# Patient Record
Sex: Female | Born: 2000 | Race: Black or African American | Hispanic: No | Marital: Single | Smoking: Former smoker
Health system: Southern US, Community
[De-identification: ages and names within clinical notes are randomized; demographics above are authoritative.]

## PROBLEM LIST (undated history)

## (undated) DIAGNOSIS — R519 Headache, unspecified: Secondary | ICD-10-CM

## (undated) DIAGNOSIS — G8929 Other chronic pain: Secondary | ICD-10-CM

## (undated) DIAGNOSIS — R51 Headache: Secondary | ICD-10-CM

## (undated) DIAGNOSIS — J45909 Unspecified asthma, uncomplicated: Secondary | ICD-10-CM

## (undated) HISTORY — DX: Unspecified asthma, uncomplicated: J45.909

## (undated) HISTORY — PX: NO PAST SURGERIES: SHX2092

---
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
## 2021-08-30 ENCOUNTER — Inpatient Hospital Stay (HOSPITAL_COMMUNITY)
Admission: AD | Admit: 2021-08-30 | Discharge: 2021-08-30 | Disposition: A | Discharge status: Home / Self Care | Payer: Medicaid Other | Attending: Obstetrics & Gynecology | Admitting: Obstetrics & Gynecology

## 2021-08-30 ENCOUNTER — Encounter (HOSPITAL_COMMUNITY): Payer: Self-pay

## 2021-08-30 DIAGNOSIS — Y9289 Other specified places as the place of occurrence of the external cause: Secondary | ICD-10-CM | POA: Diagnosis not present

## 2021-08-30 DIAGNOSIS — O26891 Other specified pregnancy related conditions, first trimester: Secondary | ICD-10-CM | POA: Insufficient documentation

## 2021-08-30 DIAGNOSIS — M25551 Pain in right hip: Secondary | ICD-10-CM

## 2021-08-30 DIAGNOSIS — W010XXA Fall on same level from slipping, tripping and stumbling without subsequent striking against object, initial encounter: Secondary | ICD-10-CM | POA: Diagnosis not present

## 2021-08-30 DIAGNOSIS — W19XXXA Unspecified fall, initial encounter: Secondary | ICD-10-CM

## 2021-08-30 DIAGNOSIS — Y990 Civilian activity done for income or pay: Secondary | ICD-10-CM | POA: Diagnosis not present

## 2021-08-30 DIAGNOSIS — Z3A17 17 weeks gestation of pregnancy: Secondary | ICD-10-CM | POA: Diagnosis not present

## 2021-08-30 DIAGNOSIS — M25561 Pain in right knee: Secondary | ICD-10-CM | POA: Insufficient documentation

## 2021-08-30 LAB — POCT PREGNANCY, URINE: Preg Test, Ur: POSITIVE — AB

## 2021-08-30 NOTE — MAU Note (Signed)
.  Shelley Hill is a 21 y.o. at [redacted]w[redacted]d here in MAU reporting: fell at work about 2 hours ago.Pt reports right sided pain from where she fell and slight ABD cramping. Pt stated slipped on a water on a hardwood floor and fell on her left side. Pt denies CTX, LOF, VB, and trauma to ABD. Pt states she does not have any PNC or OBGYN, has been to [facility] confirmation and ultrasound.   Onset of complaint: 1800 Pain score: 5/10 side, 4/10 cramping Vitals:   08/30/21 2017  BP: 130/70  Pulse: 88  Resp: 18  Temp: 98.2 F (36.8 C)  SpO2: 98%     FHT:152 Lab orders placed from triage:

## 2021-08-30 NOTE — MAU Provider Note (Incomplete)
Chief Complaint:  Fall   Event Date/Time   First Provider Initiated Contact with Patient 08/30/21 2045     HPI: Shelley Hill is a 21 y.o. G1P0 at 65w5dwho presents to maternity admissions reporting having fallen at work on wet floor.  Landed on right hip  Did not strike abdomen. . She denies LOF, vaginal bleeding, vaginal itching/burning, urinary symptoms, h/a, dizziness, n/v, diarrhea, constipation or fever/chills.  .  Fall The accident occurred 1 to 3 hours ago. The fall occurred while walking. She landed on [employer]. There was no blood loss. Point of impact: right hip. The pain is present in the right hip. The symptoms are aggravated by ambulation and pressure on injury. Pertinent negatives include no abdominal pain, fever, headaches or loss of consciousness. She has tried nothing for the symptoms.   RN Note: Shelley Hill is a 21 y.o. at [redacted]w[redacted]d here in MAU reporting: fell at work about 2 hours ago.Pt reports right sided pain from where she fell and slight ABD cramping. Pt stated slipped on a water on a hardwood floor and fell on her left side. Pt denies CTX, LOF, VB, and trauma to ABD. Pt states she does not have any PNC or OBGYN, has been to [facility] confirmation and ultrasound.   Past Medical History: Past Medical History:  Diagnosis Date   Chronic headaches     Past obstetric history: OB History  Gravida Para Term Preterm AB Living  1            SAB IAB Ectopic Multiple Live Births               # Outcome Date GA Lbr Len/2nd Weight Sex Delivery Anes PTL Lv  1 Current             Past Surgical History: History reviewed. No pertinent surgical history.  Family History: History reviewed. No pertinent family history.  Social History: Social History   Tobacco Use   Smoking status: Never   Smokeless tobacco: Never  Substance Use Topics   Alcohol use: No    Allergies: No Known Allergies  Meds:  Medications Prior to Admission  Medication Sig  Dispense Refill Last Dose   ranitidine (ZANTAC) 150 MG capsule Take 1 capsule (150 mg total) by mouth daily. (Patient not taking: Reported on 08/30/2021) 30 capsule 0 Not Taking    I have reviewed patient's Past Medical Hx, Surgical Hx, Family Hx, Social Hx, medications and allergies.   ROS:  Review of Systems  Constitutional:  Negative for fever.  Gastrointestinal:  Negative for abdominal pain.  Neurological:  Negative for loss of consciousness and headaches.   Other systems negative  Physical Exam  Patient Vitals for the past 24 hrs:  BP Temp Pulse Resp SpO2 Height Weight  08/30/21 2017 130/70 98.2 F (36.8 C) 88 18 98 % 5' 2.5" (1.588 m) 94.8 kg   Constitutional: Well-developed, well-nourished female in no acute distress.  Cardiovascular: normal rate and rhythm Respiratory: normal effort, clear to auscultation bilaterally GI: Abd soft, non-tender, gravid appropriate for gestational age.   No rebound or guarding. MS: Extremities nontender, no edema, normal ROM   Normal range of motion with hip and leg.  No deformity, obvious injury or impairment noted Neurologic: Alert and oriented x 4.  GU: Neg CVAT.   FHT:  152      Labs: Results for orders placed or performed during the hospital encounter of 08/30/21 (from the past 24 hour(s))  Pregnancy, urine POC  Status: Abnormal   Collection Time: 08/30/21  7:55 PM  Result Value Ref Range   Preg Test, Ur POSITIVE (A) NEGATIVE    Imaging:  Informal bedside US done for reassurance. Pt informed that the ultrasound is considered a limited OB ultrasound and is not intended to be a complete ultrasound exam.  Patient also informed that the ultrasound is not being completed with the intent of assessing for fetal or placental anomalies or any pelvic abnormalities.  Explained that the purpose of today's ultrasound is to assess for reassurance Patient acknowledges the purpose of the exam and the limitations of the study.    SIngle active  Fetus noted, size appropriate for dates, but no measurements taken FHR 160s.  Fetus very active   Placenta anterior/left  MAU Course/MDM: I have reviewed the triage vital signs and the nursing notes.   Pertinent labs & imaging results that were available during my care of the patient were reviewed by me and considered in my medical decision making (see chart for details).      I have reviewed her medical records including past results, notes and treatments.   DIscussed anatomy and stage of gestation is protective for fetus at this gestational age Discussed the uterus and amniotic sac provide cushioning Reassured patient and her mother with [place] verification of fetal well-being  Assessment: SIngle IUP at [redacted]w[redacted]d Status post fall on right hip Reassuring fetal status  Plan: Discharge home Preterm Labor precautions and fetal kick counts Follow up in Office for prenatal visits  List of providers given Encouraged to return if she develops worsening of symptoms, increase in pain, fever, or other concerning symptoms.  Pt stable at time of discharge.  Wynelle Bourgeois CNM, MSN Certified Nurse-Midwife 08/30/2021 8:45 PM

## 2021-08-30 NOTE — Discharge Instructions (Signed)
[facility]   [facility]             [address], [place], [place] [address] [phone]  [facility]                                                             [address], [address], [place], [place], [address] [phone]  [facility] [place]                                    [address], [address], [place], [place], [address] [phone]  [facility] [place] [address], [address], [place], [place], [address] [phone]  [facility] [place] [facility]                                 [address], [place], [place], [address] [phone]  [facility]                                    [address], [place], [place], [address] [phone]  [facility] [address], [address], [place], [place], [address]                              [facility] [address], [address], [place], [place], [address] [phone]  [facility]         Phone: [phone]  [facility]      Phone: [phone]   [facility]      Phone: [phone]  [facility]         Phone: [phone]   [facility]    Phone: [phone]  [facility]      Phone: [phone]  [facility]      Phone: [phone]  [facility]        Phone: [phone]  [facility] (Sheronette Cousins) [phone]  [facility]      Phone: [phone]   

## 2021-09-14 LAB — OB RESULTS CONSOLE HEPATITIS B SURFACE ANTIGEN: Hepatitis B Surface Ag: NEGATIVE

## 2021-09-14 LAB — OB RESULTS CONSOLE ANTIBODY SCREEN: Antibody Screen: NEGATIVE

## 2021-09-14 LAB — OB RESULTS CONSOLE ABO/RH: RH Type: POSITIVE

## 2021-09-14 LAB — HEPATITIS C ANTIBODY: HCV Ab: NEGATIVE

## 2021-09-14 LAB — OB RESULTS CONSOLE GC/CHLAMYDIA
Chlamydia: NEGATIVE
Neisseria Gonorrhea: NEGATIVE

## 2021-09-14 LAB — OB RESULTS CONSOLE RUBELLA ANTIBODY, IGM: Rubella: IMMUNE

## 2021-09-14 LAB — OB RESULTS CONSOLE HIV ANTIBODY (ROUTINE TESTING): HIV: NONREACTIVE

## 2021-09-14 LAB — OB RESULTS CONSOLE RPR: RPR: NONREACTIVE

## 2021-09-29 ENCOUNTER — Other Ambulatory Visit: Payer: Self-pay

## 2021-10-02 ENCOUNTER — Other Ambulatory Visit: Payer: Self-pay | Admitting: Obstetrics & Gynecology

## 2021-10-02 DIAGNOSIS — Z3A22 22 weeks gestation of pregnancy: Secondary | ICD-10-CM

## 2021-10-02 DIAGNOSIS — Z363 Encounter for antenatal screening for malformations: Secondary | ICD-10-CM

## 2021-10-03 ENCOUNTER — Encounter: Payer: Self-pay | Admitting: *Deleted

## 2021-10-03 ENCOUNTER — Ambulatory Visit: Payer: Medicaid Other | Admitting: *Deleted

## 2021-10-03 ENCOUNTER — Other Ambulatory Visit: Payer: Self-pay | Admitting: *Deleted

## 2021-10-03 ENCOUNTER — Ambulatory Visit: Payer: Medicaid Other | Attending: Obstetrics & Gynecology

## 2021-10-03 DIAGNOSIS — Z3A22 22 weeks gestation of pregnancy: Secondary | ICD-10-CM | POA: Insufficient documentation

## 2021-10-03 DIAGNOSIS — Z363 Encounter for antenatal screening for malformations: Secondary | ICD-10-CM | POA: Insufficient documentation

## 2021-10-03 DIAGNOSIS — O99210 Obesity complicating pregnancy, unspecified trimester: Secondary | ICD-10-CM

## 2021-10-31 ENCOUNTER — Ambulatory Visit: Payer: Medicaid Other | Attending: Maternal & Fetal Medicine

## 2021-10-31 ENCOUNTER — Ambulatory Visit: Payer: Medicaid Other | Admitting: *Deleted

## 2021-10-31 ENCOUNTER — Encounter: Payer: Self-pay | Admitting: *Deleted

## 2021-10-31 DIAGNOSIS — O99212 Obesity complicating pregnancy, second trimester: Secondary | ICD-10-CM | POA: Diagnosis not present

## 2021-10-31 DIAGNOSIS — O99210 Obesity complicating pregnancy, unspecified trimester: Secondary | ICD-10-CM | POA: Diagnosis present

## 2021-10-31 DIAGNOSIS — Z362 Encounter for other antenatal screening follow-up: Secondary | ICD-10-CM | POA: Diagnosis not present

## 2021-10-31 DIAGNOSIS — Z3A26 26 weeks gestation of pregnancy: Secondary | ICD-10-CM | POA: Diagnosis not present

## 2021-10-31 DIAGNOSIS — E669 Obesity, unspecified: Secondary | ICD-10-CM

## 2021-11-01 ENCOUNTER — Other Ambulatory Visit: Payer: Self-pay | Admitting: *Deleted

## 2021-11-01 DIAGNOSIS — O99212 Obesity complicating pregnancy, second trimester: Secondary | ICD-10-CM

## 2021-11-01 DIAGNOSIS — Z6839 Body mass index (BMI) 39.0-39.9, adult: Secondary | ICD-10-CM

## 2021-11-28 ENCOUNTER — Ambulatory Visit: Payer: Medicaid Other | Attending: Maternal & Fetal Medicine

## 2021-11-28 ENCOUNTER — Ambulatory Visit: Payer: Medicaid Other | Admitting: *Deleted

## 2021-11-28 VITALS — BP 129/74 | HR 96

## 2021-11-28 DIAGNOSIS — Z3A30 30 weeks gestation of pregnancy: Secondary | ICD-10-CM | POA: Diagnosis not present

## 2021-11-28 DIAGNOSIS — O99213 Obesity complicating pregnancy, third trimester: Secondary | ICD-10-CM | POA: Diagnosis not present

## 2021-11-28 DIAGNOSIS — E669 Obesity, unspecified: Secondary | ICD-10-CM | POA: Diagnosis not present

## 2021-11-28 DIAGNOSIS — O99210 Obesity complicating pregnancy, unspecified trimester: Secondary | ICD-10-CM | POA: Insufficient documentation

## 2021-11-29 ENCOUNTER — Other Ambulatory Visit: Payer: Self-pay | Admitting: *Deleted

## 2021-11-29 DIAGNOSIS — R638 Other symptoms and signs concerning food and fluid intake: Secondary | ICD-10-CM

## 2021-11-29 DIAGNOSIS — O99213 Obesity complicating pregnancy, third trimester: Secondary | ICD-10-CM

## 2021-12-11 LAB — OB RESULTS CONSOLE HIV ANTIBODY (ROUTINE TESTING): HIV: NONREACTIVE

## 2021-12-11 LAB — OB RESULTS CONSOLE RPR: RPR: NONREACTIVE

## 2021-12-22 ENCOUNTER — Inpatient Hospital Stay (HOSPITAL_COMMUNITY)
Admission: AD | Admit: 2021-12-22 | Discharge: 2021-12-22 | Disposition: A | Discharge status: Home / Self Care | Payer: Medicaid Other | Attending: Obstetrics & Gynecology | Admitting: Obstetrics & Gynecology

## 2021-12-22 ENCOUNTER — Encounter (HOSPITAL_COMMUNITY): Payer: Self-pay | Admitting: Obstetrics & Gynecology

## 2021-12-22 DIAGNOSIS — Z3A34 34 weeks gestation of pregnancy: Secondary | ICD-10-CM

## 2021-12-22 DIAGNOSIS — Z3689 Encounter for other specified antenatal screening: Secondary | ICD-10-CM

## 2021-12-22 DIAGNOSIS — O368130 Decreased fetal movements, third trimester, not applicable or unspecified: Secondary | ICD-10-CM | POA: Diagnosis present

## 2021-12-22 NOTE — MAU Provider Note (Signed)
History     [id number]  Arrival date and time: 12/22/21 1948    Chief Complaint  Patient presents with   Decreased Fetal Movement     HPI Shelley Hill is a 21 y.o. at [redacted]w[redacted]d who presents for decreased fetal movement. First noticed 3 days ago. States she only felt baby move 3 times today prior to coming to MAU. Denies abdominal pain, vaginal bleeding, or LOF. Goes to [facility]. Denies complications with pregnancy. Has next OB appointment on Monday.    OB History     Gravida  1   Para      Term      Preterm      AB      Living         SAB      IAB      Ectopic      Multiple      Live Births              Past Medical History:  Diagnosis Date   Chronic headaches     Past Surgical History:  Procedure Laterality Date   NO PAST SURGERIES      Family History  Problem Relation Age of Onset   Epilepsy Father    Cancer Brother    Hypertension Paternal Grandmother    Heart disease Paternal Grandmother    Asthma Neg Hx     No Known Allergies  No current facility-administered medications on file prior to encounter.   Current Outpatient Medications on File Prior to Encounter  Medication Sig Dispense Refill   Prenatal Vit-Fe Fumarate-FA (MULTIVITAMIN-PRENATAL) 27-0.8 MG TABS tablet Take 1 tablet by mouth daily at 12 noon.       ROS Pertinent positives and negative per HPI, all others reviewed and negative  Physical Exam   BP 139/75   Pulse 93   Temp 98.2 F (36.8 C) (Oral)   Resp 18   Ht 5' 2.5" (1.588 m)   Wt 100.5 kg   LMP 04/28/2021   SpO2 97%   BMI 39.89 kg/m   Patient Vitals for the past 24 hrs:  BP Temp Temp src Pulse Resp SpO2 Height Weight  12/22/21 2016 -- -- -- -- -- 97 % -- --  12/22/21 2015 139/75 -- -- 93 -- -- -- --  12/22/21 1958 129/71 98.2 F (36.8 C) Oral 94 18 -- 5' 2.5" (1.588 m) 100.5 kg    Physical Exam Vitals and nursing note reviewed.  Constitutional:      General: She is not in acute distress.     Appearance: Normal appearance.  HENT:     Head: Normocephalic and atraumatic.  Eyes:     General: No scleral icterus.    Conjunctiva/sclera: Conjunctivae normal.  Pulmonary:     Effort: Pulmonary effort is normal. No respiratory distress.  Neurological:     Mental Status: She is alert.  Psychiatric:        Mood and Affect: Mood normal.        Behavior: Behavior normal.       FHT Baseline 135, moderate variability, 15x15 accels, no decels Toco: none Cat: 1  Labs No results found for this or any previous visit (from the past 24 hour(s)).  Imaging No results found.  MAU Course  Procedures Lab Orders  No laboratory test(s) ordered today   No orders of the defined types were placed in this encounter.  Imaging Orders  No imaging studies ordered today    MDM  Patient documented 10 movements during her NST Reactive NST Patient reassured Assessment and Plan   1. Decreased fetal movements in third trimester, single or unspecified fetus   2. NST (non-stress test) reactive   3. [redacted] weeks gestation of pregnancy    -Reviewed fetal kick counts & reasons to return to MAU -Keep f/u with Frances Maywood, NP 12/22/21 9:15 PM

## 2021-12-22 NOTE — MAU Note (Signed)
Pt says last time baby moved was at 5pm In Triage - FHR- was 145 Less movement x3 days  PNC- HD-  Went to Dr last week- told to come here - not call them Feels irreg UC's

## 2021-12-26 ENCOUNTER — Ambulatory Visit: Payer: Medicaid Other

## 2022-01-11 LAB — OB RESULTS CONSOLE GC/CHLAMYDIA
Chlamydia: NEGATIVE
Neisseria Gonorrhea: NEGATIVE

## 2022-01-11 LAB — OB RESULTS CONSOLE GBS: GBS: POSITIVE

## 2022-01-15 DIAGNOSIS — O139 Gestational [pregnancy-induced] hypertension without significant proteinuria, unspecified trimester: Secondary | ICD-10-CM

## 2022-01-15 HISTORY — DX: Gestational (pregnancy-induced) hypertension without significant proteinuria, unspecified trimester: O13.9

## 2022-01-15 NOTE — L&D Delivery Note (Signed)
Delivery Note Shelley Hill is a 22 y.o. G1P0 at [redacted]w[redacted]d admitted for SOL and new onset gHTN.   GBS Status: Positive/-- (12/28 0000) Maximum Maternal Temperature: 98.8  Labor course: Initial SVE: 4/90/-2. Augmentation with: AROM and Pitocin. She then progressed to complete.  ROM: 5h 63m with meconium stained fluid fluid  Birth: At 1722 a viable female was delivered via spontaneous vaginal delivery (Presentation: cephalic;LOA). Nuchal cord present: No.  Shoulders and body delivered in usual fashion. Infant placed directly on mom's abdomen for bonding/skin-to-skin, baby dried and stimulated. Cord clamped x 2 after 1 minute and cut by FOB. Cord blood collected. The placenta separated spontaneously and delivered via gentle cord traction.  Pitocin infused rapidly IV per protocol.  Fundus firm with massage.  Placenta inspected and appears to be intact with a 3 VC.  Placenta/Cord with the following complications: none. Cord pH: n/a Sponge and instrument count were correct x2.  Intrapartum complications:  None Anesthesia:  local and epidural Episiotomy: none Lacerations:  1st degree perineal, right labial Suture Repair: 3.0 vicryl rapide EBL (mL): 426   Infant: APGAR (1 MIN): 9   APGAR (5 MINS): 9   Infant weight: pending  Mom to postpartum.  Baby to Couplet care / Skin to Skin. Placenta to L&D   Plans to Breastfeed Contraception: OCP (estrogen/progesterone) Circumcision: N/A  Note sent to [facility]: N/A, [facility] pt for pp visit.   [place] 02/04/2022 5:52 PM

## 2022-01-26 ENCOUNTER — Encounter (HOSPITAL_COMMUNITY): Payer: Self-pay | Admitting: Obstetrics and Gynecology

## 2022-01-26 ENCOUNTER — Other Ambulatory Visit: Payer: Self-pay

## 2022-01-26 ENCOUNTER — Inpatient Hospital Stay (HOSPITAL_COMMUNITY)
Admission: AD | Admit: 2022-01-26 | Discharge: 2022-01-26 | Disposition: A | Discharge status: Home / Self Care | Payer: Medicaid Other | Attending: Obstetrics and Gynecology | Admitting: Obstetrics and Gynecology

## 2022-01-26 DIAGNOSIS — O26893 Other specified pregnancy related conditions, third trimester: Secondary | ICD-10-CM | POA: Diagnosis present

## 2022-01-26 DIAGNOSIS — M549 Dorsalgia, unspecified: Secondary | ICD-10-CM | POA: Diagnosis not present

## 2022-01-26 DIAGNOSIS — O471 False labor at or after 37 completed weeks of gestation: Secondary | ICD-10-CM | POA: Diagnosis not present

## 2022-01-26 DIAGNOSIS — Z3A39 39 weeks gestation of pregnancy: Secondary | ICD-10-CM | POA: Diagnosis not present

## 2022-01-26 NOTE — MAU Note (Signed)
Patient arrived to MAU with C/O back pain that radiates to the lower abdomen. She also reports pressure that comes and goes. She states that the pain started around 10 am yesterday. She denies VB or LOF

## 2022-01-26 NOTE — MAU Provider Note (Signed)
  S: Ms. Shelley Hill is a 22 y.o. G1P0 at [redacted]w[redacted]d  who presents to MAU today complaining of back pain radiating to her abdomen that started about 10am yesterday.  She denies vaginal bleeding. She denies LOF. She reports normal fetal movement.    O: BP 127/78   Pulse 100   Temp 98.1 F (36.7 C) (Oral)   Resp 18   Ht 5\' 2"  (1.575 m)   Wt 103 kg   LMP 04/28/2021   BMI 41.52 kg/m   Cervical exam:  Dilation: Closed Effacement (%): Thick Cervical Position: Posterior Station: Ballotable Exam by:: Linton Rump, RN   Fetal Monitoring: Baseline: 135bppm Variability: moderate Accelerations: >2 15X15 Decelerations: no decels Contractions: none   A: SIUP at [redacted]w[redacted]d  False labor  P: Discharge home in stable condition. Labor precautions given.  Stormy Card, MD 01/26/2022 10:20 PM

## 2022-02-02 ENCOUNTER — Other Ambulatory Visit: Payer: Self-pay | Admitting: Obstetrics & Gynecology

## 2022-02-02 DIAGNOSIS — O480 Post-term pregnancy: Secondary | ICD-10-CM

## 2022-02-04 ENCOUNTER — Inpatient Hospital Stay (HOSPITAL_COMMUNITY): Payer: Medicaid Other | Admitting: Anesthesiology

## 2022-02-04 ENCOUNTER — Encounter (HOSPITAL_COMMUNITY): Payer: Self-pay | Admitting: Obstetrics and Gynecology

## 2022-02-04 ENCOUNTER — Inpatient Hospital Stay (HOSPITAL_COMMUNITY)
Admission: AD | Admit: 2022-02-04 | Discharge: 2022-02-06 | DRG: 807 | Disposition: A | Discharge status: Home / Self Care | Payer: Medicaid Other | Attending: Family Medicine | Admitting: Family Medicine

## 2022-02-04 ENCOUNTER — Other Ambulatory Visit: Payer: Self-pay

## 2022-02-04 DIAGNOSIS — Z3A40 40 weeks gestation of pregnancy: Secondary | ICD-10-CM

## 2022-02-04 DIAGNOSIS — O99354 Diseases of the nervous system complicating childbirth: Secondary | ICD-10-CM | POA: Diagnosis present

## 2022-02-04 DIAGNOSIS — O99214 Obesity complicating childbirth: Secondary | ICD-10-CM | POA: Diagnosis present

## 2022-02-04 DIAGNOSIS — Z148 Genetic carrier of other disease: Secondary | ICD-10-CM

## 2022-02-04 DIAGNOSIS — G4733 Obstructive sleep apnea (adult) (pediatric): Secondary | ICD-10-CM | POA: Diagnosis present

## 2022-02-04 DIAGNOSIS — O99824 Streptococcus B carrier state complicating childbirth: Secondary | ICD-10-CM | POA: Diagnosis not present

## 2022-02-04 DIAGNOSIS — O480 Post-term pregnancy: Secondary | ICD-10-CM | POA: Diagnosis present

## 2022-02-04 DIAGNOSIS — O134 Gestational [pregnancy-induced] hypertension without significant proteinuria, complicating childbirth: Secondary | ICD-10-CM | POA: Diagnosis not present

## 2022-02-04 DIAGNOSIS — O139 Gestational [pregnancy-induced] hypertension without significant proteinuria, unspecified trimester: Principal | ICD-10-CM

## 2022-02-04 DIAGNOSIS — O99820 Streptococcus B carrier state complicating pregnancy: Secondary | ICD-10-CM

## 2022-02-04 LAB — COMPREHENSIVE METABOLIC PANEL
ALT: 15 U/L (ref 0–44)
AST: 22 U/L (ref 15–41)
Albumin: 3.1 g/dL — ABNORMAL LOW (ref 3.5–5.0)
Alkaline Phosphatase: 91 U/L (ref 38–126)
Anion gap: 11 (ref 5–15)
BUN: 9 mg/dL (ref 6–20)
CO2: 19 mmol/L — ABNORMAL LOW (ref 22–32)
Calcium: 9.1 mg/dL (ref 8.9–10.3)
Chloride: 103 mmol/L (ref 98–111)
Creatinine, Ser: 0.79 mg/dL (ref 0.44–1.00)
GFR, Estimated: >60 mL/min (ref >60)
Glucose, Bld: 109 mg/dL — ABNORMAL HIGH (ref 70–99)
Potassium: 4.1 mmol/L (ref 3.5–5.1)
Sodium: 133 mmol/L — ABNORMAL LOW (ref 135–145)
Total Bilirubin: 0.1 mg/dL — ABNORMAL LOW (ref 0.3–1.2)
Total Protein: 7 g/dL (ref 6.5–8.1)

## 2022-02-04 LAB — TYPE AND SCREEN
ABO/RH(D): B POS
Antibody Screen: NEGATIVE

## 2022-02-04 LAB — CBC
HCT: 38.8 % (ref 36.0–46.0)
HCT: 33.6 % — ABNORMAL LOW (ref 36.0–46.0)
Hemoglobin: 12.5 g/dL (ref 12.0–15.0)
Hemoglobin: 11.3 g/dL — ABNORMAL LOW (ref 12.0–15.0)
MCH: 26.2 pg (ref 26.0–34.0)
MCH: 27 pg (ref 26.0–34.0)
MCHC: 32.2 g/dL (ref 30.0–36.0)
MCHC: 33.6 g/dL (ref 30.0–36.0)
MCV: 81.2 fL (ref 80.0–100.0)
MCV: 80.2 fL (ref 80.0–100.0)
Platelets: 240 10*3/uL (ref 150–400)
Platelets: 236 10*3/uL (ref 150–400)
RBC: 4.78 MIL/uL (ref 3.87–5.11)
RBC: 4.19 MIL/uL (ref 3.87–5.11)
RDW: 13.2 % (ref 11.5–15.5)
RDW: 13.2 % (ref 11.5–15.5)
WBC: 7.9 10*3/uL (ref 4.0–10.5)
WBC: 9.6 10*3/uL (ref 4.0–10.5)
nRBC: 0 % (ref 0.0–0.2)
nRBC: 0 % (ref 0.0–0.2)

## 2022-02-04 LAB — PROTEIN / CREATININE RATIO, URINE
Creatinine, Urine: 135 mg/dL
Protein Creatinine Ratio: 0.14 mg/mg{Cre} (ref 0.00–0.15)
Total Protein, Urine: 19 mg/dL

## 2022-02-04 LAB — HIV ANTIBODY (ROUTINE TESTING W REFLEX): HIV Screen 4th Generation wRfx: NONREACTIVE

## 2022-02-04 LAB — RPR: RPR Ser Ql: NONREACTIVE

## 2022-02-04 MED ORDER — LABETALOL HCL 5 MG/ML IV SOLN: 40.0000 mg | INTRAVENOUS | Status: DC | PRN

## 2022-02-04 MED ORDER — LABETALOL HCL 5 MG/ML IV SOLN: 20.0000 mg | INTRAVENOUS | Status: DC | PRN

## 2022-02-04 MED ORDER — PENICILLIN G POT IN DEXTROSE 60000 UNIT/ML IV SOLN
3.0000 10*6.[IU] | INTRAVENOUS | Status: DC
  Administered 2022-02-04 (×2): 3 10*6.[IU] via INTRAVENOUS
  Filled 2022-02-04 (×2): qty 50

## 2022-02-04 MED ORDER — OXYTOCIN BOLUS FROM INFUSION
333.0000 mL | Freq: Once | INTRAVENOUS | Status: AC
  Administered 2022-02-04: 333 mL via INTRAVENOUS

## 2022-02-04 MED ORDER — PHENYLEPHRINE 80 MCG/ML (10ML) SYRINGE FOR IV PUSH (FOR BLOOD PRESSURE SUPPORT): 80.0000 ug | PREFILLED_SYRINGE | INTRAVENOUS | Status: DC | PRN

## 2022-02-04 MED ORDER — ONDANSETRON HCL 4 MG/2ML IJ SOLN: 4.0000 mg | Freq: Four times a day (QID) | INTRAMUSCULAR | Status: DC | PRN

## 2022-02-04 MED ORDER — LACTATED RINGERS IV SOLN: INTRAVENOUS | Status: DC

## 2022-02-04 MED ORDER — IBUPROFEN 600 MG PO TABS
600.0000 mg | ORAL_TABLET | Freq: Four times a day (QID) | ORAL | Status: DC
  Administered 2022-02-05 – 2022-02-06 (×5): 600 mg via ORAL
  Filled 2022-02-04 (×7): qty 1

## 2022-02-04 MED ORDER — DIBUCAINE (PERIANAL) 1 % EX OINT: 1.0000 | TOPICAL_OINTMENT | CUTANEOUS | Status: DC | PRN

## 2022-02-04 MED ORDER — ACETAMINOPHEN 325 MG PO TABS: 650.0000 mg | ORAL_TABLET | ORAL | Status: DC | PRN

## 2022-02-04 MED ORDER — LIDOCAINE HCL (PF) 1 % IJ SOLN
INTRAMUSCULAR | Status: DC | PRN
  Administered 2022-02-04 (×2): 4 mL via EPIDURAL

## 2022-02-04 MED ORDER — FENTANYL-BUPIVACAINE-NACL 0.5-0.125-0.9 MG/250ML-% EP SOLN
12.0000 mL/h | EPIDURAL | Status: DC | PRN
  Administered 2022-02-04: 12 mL/h via EPIDURAL
  Filled 2022-02-04: qty 250

## 2022-02-04 MED ORDER — FLEET ENEMA 7-19 GM/118ML RE ENEM: 1.0000 | ENEMA | RECTAL | Status: DC | PRN

## 2022-02-04 MED ORDER — LABETALOL HCL 5 MG/ML IV SOLN: 80.0000 mg | INTRAVENOUS | Status: DC | PRN

## 2022-02-04 MED ORDER — WITCH HAZEL-GLYCERIN EX PADS: 1.0000 | MEDICATED_PAD | CUTANEOUS | Status: DC | PRN

## 2022-02-04 MED ORDER — EPHEDRINE 5 MG/ML INJ: 10.0000 mg | INTRAVENOUS | Status: DC | PRN

## 2022-02-04 MED ORDER — LIDOCAINE HCL (PF) 1 % IJ SOLN
30.0000 mL | INTRAMUSCULAR | Status: AC | PRN
  Administered 2022-02-04: 30 mL via SUBCUTANEOUS
  Filled 2022-02-04: qty 30

## 2022-02-04 MED ORDER — OXYTOCIN-SODIUM CHLORIDE 30-0.9 UT/500ML-% IV SOLN
1.0000 m[IU]/min | INTRAVENOUS | Status: DC
  Administered 2022-02-04: 2 m[IU]/min via INTRAVENOUS

## 2022-02-04 MED ORDER — SIMETHICONE 80 MG PO CHEW: 80.0000 mg | CHEWABLE_TABLET | ORAL | Status: DC | PRN

## 2022-02-04 MED ORDER — HYDRALAZINE HCL 20 MG/ML IJ SOLN: 10.0000 mg | INTRAMUSCULAR | Status: DC | PRN

## 2022-02-04 MED ORDER — SENNOSIDES-DOCUSATE SODIUM 8.6-50 MG PO TABS
2.0000 | ORAL_TABLET | Freq: Every day | ORAL | Status: DC
  Administered 2022-02-05 – 2022-02-06 (×2): 2 via ORAL
  Filled 2022-02-04 (×2): qty 2

## 2022-02-04 MED ORDER — TERBUTALINE SULFATE 1 MG/ML IJ SOLN: 0.2500 mg | Freq: Once | INTRAMUSCULAR | Status: DC | PRN

## 2022-02-04 MED ORDER — SODIUM CHLORIDE 0.9 % IV SOLN
5.0000 10*6.[IU] | Freq: Once | INTRAVENOUS | Status: AC
  Administered 2022-02-04: 5 10*6.[IU] via INTRAVENOUS
  Filled 2022-02-04: qty 5

## 2022-02-04 MED ORDER — OXYCODONE-ACETAMINOPHEN 5-325 MG PO TABS: 2.0000 | ORAL_TABLET | ORAL | Status: DC | PRN

## 2022-02-04 MED ORDER — ONDANSETRON HCL 4 MG/2ML IJ SOLN: 4.0000 mg | INTRAMUSCULAR | Status: DC | PRN

## 2022-02-04 MED ORDER — COCONUT OIL OIL: 1.0000 | TOPICAL_OIL | Status: DC | PRN

## 2022-02-04 MED ORDER — DIPHENHYDRAMINE HCL 50 MG/ML IJ SOLN: 12.5000 mg | INTRAMUSCULAR | Status: DC | PRN

## 2022-02-04 MED ORDER — ONDANSETRON HCL 4 MG PO TABS: 4.0000 mg | ORAL_TABLET | ORAL | Status: DC | PRN

## 2022-02-04 MED ORDER — PRENATAL MULTIVITAMIN CH
1.0000 | ORAL_TABLET | Freq: Every day | ORAL | Status: DC
  Administered 2022-02-05 – 2022-02-06 (×2): 1 via ORAL
  Filled 2022-02-04 (×2): qty 1

## 2022-02-04 MED ORDER — ZOLPIDEM TARTRATE 5 MG PO TABS: 5.0000 mg | ORAL_TABLET | Freq: Every evening | ORAL | Status: DC | PRN

## 2022-02-04 MED ORDER — OXYCODONE-ACETAMINOPHEN 5-325 MG PO TABS: 1.0000 | ORAL_TABLET | ORAL | Status: DC | PRN

## 2022-02-04 MED ORDER — SOD CITRATE-CITRIC ACID 500-334 MG/5ML PO SOLN: 30.0000 mL | ORAL | Status: DC | PRN

## 2022-02-04 MED ORDER — BENZOCAINE-MENTHOL 20-0.5 % EX AERO: 1.0000 | INHALATION_SPRAY | CUTANEOUS | Status: DC | PRN

## 2022-02-04 MED ORDER — TETANUS-DIPHTH-ACELL PERTUSSIS 5-2.5-18.5 LF-MCG/0.5 IM SUSY: 0.5000 mL | PREFILLED_SYRINGE | Freq: Once | INTRAMUSCULAR | Status: DC

## 2022-02-04 MED ORDER — OXYTOCIN-SODIUM CHLORIDE 30-0.9 UT/500ML-% IV SOLN
2.5000 [IU]/h | INTRAVENOUS | Status: DC
  Administered 2022-02-04: 2.5 [IU]/h via INTRAVENOUS
  Filled 2022-02-04: qty 500

## 2022-02-04 MED ORDER — LACTATED RINGERS IV SOLN
500.0000 mL | Freq: Once | INTRAVENOUS | Status: AC
  Administered 2022-02-04: 500 mL via INTRAVENOUS

## 2022-02-04 MED ORDER — LACTATED RINGERS IV SOLN: 500.0000 mL | INTRAVENOUS | Status: DC | PRN

## 2022-02-04 MED ORDER — DIPHENHYDRAMINE HCL 25 MG PO CAPS: 25.0000 mg | ORAL_CAPSULE | Freq: Four times a day (QID) | ORAL | Status: DC | PRN

## 2022-02-04 NOTE — Anesthesia Procedure Notes (Signed)
Epidural Patient location during procedure: OB Start time: 02/04/2022 7:31 AM End time: 02/04/2022 7:34 AM  Staffing Anesthesiologist: Brennan Bailey, MD Performed: anesthesiologist   Preanesthetic Checklist Completed: patient identified, IV checked, risks and benefits discussed, monitors and equipment checked, pre-op evaluation and timeout performed  Epidural Patient position: sitting Prep: DuraPrep and site prepped and draped Patient monitoring: continuous pulse ox, blood pressure and heart rate Approach: midline Location: L3-L4 Injection technique: LOR air  Needle:  Needle type: Tuohy  Needle gauge: 17 G Needle length: 9 cm Needle insertion depth: 8 cm Catheter type: closed end flexible Catheter size: 19 Gauge Catheter at skin depth: 13 cm Test dose: negative and Other (1% lidocaine)  Assessment Events: blood not aspirated, no cerebrospinal fluid, injection not painful, no injection resistance, no paresthesia and negative IV test  Additional Notes Patient identified. Risks, benefits, and alternatives discussed with patient including but not limited to bleeding, infection, nerve damage, paralysis, failed block, incomplete pain control, headache, blood pressure changes, nausea, vomiting, reactions to medication, itching, and postpartum back pain. Confirmed with bedside nurse the patient's most recent platelet count. Confirmed with patient that they are not currently taking any anticoagulation, have any bleeding history, or any family history of bleeding disorders. Patient expressed understanding and wished to proceed. All questions were answered. Sterile technique was used throughout the entire procedure. Please see nursing notes for vital signs.   Crisp LOR after one needle redirection. Test dose was given through epidural catheter and negative prior to continuing to dose epidural or start infusion. Warning signs of high block given to the patient including shortness of breath,  tingling/numbness in hands, complete motor block, or any concerning symptoms with instructions to call for help. Patient was given instructions on fall risk and not to get out of bed. All questions and concerns addressed with instructions to call with any issues or inadequate analgesia.  Reason for block:procedure for pain

## 2022-02-04 NOTE — Discharge Summary (Addendum)
Postpartum Discharge Summary      Patient Name: Shelley Hill DOB: [birth year] MRN: [id number]  Date of admission: 02/04/2022 Delivery date:02/04/2022  Delivering provider: Renee Harder  Date of discharge: 02/06/2022  Admitting diagnosis: Normal labor [O80, Z37.9] Intrauterine pregnancy: [redacted]w[redacted]d     Secondary diagnosis:  Principal Problem:   Gestational HTN Active Problems:   Obstructive sleep apnea   SVD (spontaneous vaginal delivery)   Group B streptococcal carriage complicating pregnancy  Additional problems:     Discharge diagnosis: Term Pregnancy Delivered and Gestational Hypertension                                              Post partum procedures: N/A Augmentation: AROM and Pitocin Complications: None  Hospital course: Onset of Labor With Vaginal Delivery      22 y.o. yo G1P0 at [redacted]w[redacted]d was admitted in Latent Labor on 02/04/2022. Labor course was complicated by gestational hypertension  Membrane Rupture Time/Date: 12:00 PM ,02/04/2022   Delivery Method:Vaginal, Spontaneous  Episiotomy: None  Lacerations:  Labial;1st degree  Patient had a postpartum course has been uncomplicated.  She is ambulating, tolerating a regular diet, passing flatus, and urinating well. Patient is discharged home in stable condition on 02/06/22. Denies Headaches, edema, or vision changes.   Newborn Data: Birth date:02/04/2022  Birth time:5:22 PM  Gender:Female  Living status:Living  Apgars:9 ,9  Weight:3000 g   Magnesium Sulfate received: No BMZ received: No Rhophylac:N/A MMR:No T-DaP:Given prenatally Transfusion:No  Physical exam  Vitals:   02/04/22 1600 02/04/22 1630 02/04/22 1730 02/04/22 1745  BP: 137/89 (!) 141/81 (!) 145/99 (!) 131/93  Pulse: (!) 102 (!) 127 (!) 135 (!) 139  Resp: 16 18 18 16   Temp:  98 F (36.7 C) 98.1 F (36.7 C)   TempSrc:  Oral Oral   SpO2:      Weight:      Height:       General: alert Lochia: appropriate Uterine Fundus:  firm Incision: N/A DVT Evaluation: No evidence of DVT seen on physical exam. Labs: Lab Results  Component Value Date   WBC 7.9 02/04/2022   HGB 12.5 02/04/2022   HCT 38.8 02/04/2022   MCV 81.2 02/04/2022   PLT 240 02/04/2022      Latest Ref Rng & Units 02/04/2022    5:29 AM  CMP  Glucose 70 - 99 mg/dL 109   BUN 6 - 20 mg/dL 9   Creatinine 0.44 - 1.00 mg/dL 0.79   Sodium 135 - 145 mmol/L 133   Potassium 3.5 - 5.1 mmol/L 4.1   Chloride 98 - 111 mmol/L 103   CO2 22 - 32 mmol/L 19   Calcium 8.9 - 10.3 mg/dL 9.1   Total Protein 6.5 - 8.1 g/dL 7.0   Total Bilirubin 0.3 - 1.2 mg/dL 0.1   Alkaline Phos 38 - 126 U/L 91   AST 15 - 41 U/L 22   ALT 0 - 44 U/L 15    Edinburgh Score:     No data to display           After visit meds:  Allergies as of 02/06/2022   No Known Allergies      Medication List     TAKE these medications    furosemide 20 MG tablet Commonly known as: LASIX Take 1 tablet (20 mg total) by mouth  daily.   ibuprofen 600 MG tablet Commonly known as: ADVIL Take 1 tablet (600 mg total) by mouth every 6 (six) hours as needed.   multivitamin-prenatal 27-0.8 MG Tabs tablet Take 1 tablet by mouth daily at 12 noon.   NIFEdipine 30 MG 24 hr tablet Commonly known as: ADALAT CC Take 1 tablet (30 mg total) by mouth daily.   norethindrone 0.35 MG tablet Commonly known as: MICRONOR Take 1 tablet (0.35 mg total) by mouth daily.   senna-docusate 8.6-50 MG tablet Commonly known as: Senokot-S Take 2 tablets by mouth daily.   simethicone 80 MG chewable tablet Commonly known as: MYLICON Chew 1 tablet (80 mg total) by mouth as needed for flatulence.         Discharge home in stable condition Infant Feeding: Breast Infant Disposition:home with mother Discharge instruction: per After Visit Summary and Postpartum booklet. Activity: Advance as tolerated. Pelvic rest for 6 weeks.  Diet: routine diet Future Appointments: Future Appointments  Date Time  Provider [place]  02/12/2022  1:30 PM [facility] NURSE [facility]   Follow up Visit:  Patient to schedule postpartum appointment at [facility] Additional Postpartum F/U:BP check 1 week at [place] risk pregnancy complicated by: gHTN Delivery mode:  Vaginal, Spontaneous  Anticipated Birth Control:  OCPs and POPs   02/04/2022 Abram Sander, MD  Attestation of Supervision of Student:  I confirm that I have verified the information documented in the  resident's  note and that I have also personally reperformed the history, physical exam and all medical decision making activities.  I have verified that all services and findings are accurately documented in this student's note; and I agree with management and plan as outlined in the documentation. I have also made any necessary editorial changes.  See separate progress note.   Maryann Conners, [place] for [employer] [employer] 02/06/2022 10:25 AM

## 2022-02-04 NOTE — Progress Notes (Signed)
Brette Cast is a 22 y.o. G1P0 at [redacted]w[redacted]d admitted for latent labor/new onset gHTN  Subjective: Eliya is doing well. She is comfortable with epidural. She has no concerns/questions at this time.   Objective: BP 109/60   Pulse 95   Temp 98.8 F (37.1 C) (Oral)   Resp 18   Ht 5\' 2"  (1.575 m)   Wt 103.4 kg   LMP 04/28/2021   SpO2 98%   BMI 41.70 kg/m  No intake/output data recorded. No intake/output data recorded.  FHT: 145 bpm, moderate variability, +10x10 accels, +early/occasional variable decel UC: Q 22mins  SVE:   Dilation: 7 Effacement (%): 80 Station: -2 Exam by:: Bridgette Habermann, CNM  Labs: Lab Results  Component Value Date   WBC 7.9 02/04/2022   HGB 12.5 02/04/2022   HCT 38.8 02/04/2022   MCV 81.2 02/04/2022   PLT 240 02/04/2022    Assessment / Plan: Karalyn Kadel is a 22 y.o. G1P0 at [redacted]w[redacted]d admitted for latent labor/new onset gHTN.  Labor: Progressing. Discussed R/B/A to AROM. Patient agreeable to AROM. AROM with moderate amount of meconium stained fluid. Patient and fetus tolerated procedure well. gHTN: BP's currently normotensive. Labs normal. Patient asymptomatic. Continue to monitor Fetal Wellbeing:  Category I currently, with occasional variable Pain Control:  Epidural I/D:  GBS pos > PCN Anticipated MOD:  NSVD   Renee Harder, CNM 02/04/2022, 12:06 PM

## 2022-02-04 NOTE — MAU Provider Note (Signed)
[id number] Shelley Hill 08-06-2000  Patient informed that the ultrasound is considered a limited OB ultrasound and is not intended to be a complete ultrasound exam.  Patient also informed that the ultrasound is not being completed with the intent of assessing for fetal or placental anomalies or any pelvic abnormalities.  Explained that the purpose of today's ultrasound is to assess for  presentation.  Patient acknowledges the purpose of the exam and the limitations of the study.  Cephalic presentation confirmed  Maryann Conners, CNM 02/04/2022, 4:57 AM

## 2022-02-04 NOTE — Progress Notes (Signed)
Shelley Hill is a 22 y.o. G1P0 at [redacted]w[redacted]d admitted for latent labor/new onset gHTN  Subjective: Shelley Hill is reporting intermittent pelvic pressure but is otherwise doing well. She is coping well through the pain.   Objective: BP 105/89   Pulse (!) 139   Temp 97.9 F (36.6 C) (Oral)   Resp 16   Ht 5\' 2"  (1.575 m)   Wt 103.4 kg   LMP 04/28/2021   SpO2 98%   BMI 41.70 kg/m  No intake/output data recorded. Total I/O In: -  Out: 750 [Urine:750]  FHT: 140 bpm, moderate variability, no accels, +early/occasional variable decel UC: Q 2-62mins SVE:   Dilation: 8 Effacement (%): 90 Station: -1, 0 Exam by:: Varney Baas, RN  Labs: Lab Results  Component Value Date   WBC 7.9 02/04/2022   HGB 12.5 02/04/2022   HCT 38.8 02/04/2022   MCV 81.2 02/04/2022   PLT 240 02/04/2022    Assessment / Plan: Shelley Hill is a 22 y.o. G1P0 at [redacted]w[redacted]d admitted for latent labor/new onset gHTN.  Labor: Progressing. Continue Pitocin titration prn per unit policy gHTN: BP's currently normotensive. Labs normal. Patient asymptomatic. Continue to monitor Fetal Wellbeing:  Category I currently, with occasional variable Pain Control:  Epidural I/D:  GBS pos > PCN Anticipated MOD:  NSVD   Renee Harder, CNM 02/04/2022, 3:54 PM

## 2022-02-04 NOTE — Progress Notes (Signed)
Shelley Hill is a 22 y.o. G1P0 at [redacted]w[redacted]d admitted for latent labor/new onset gHTN  Subjective: Shelley Hill is doing well. She is comfortable with epidural. Feeling some pelvic/rectal pressure  Objective: BP 132/76   Pulse (!) 107   Temp 97.8 F (36.6 C) (Oral)   Resp 18   Ht 5\' 2"  (1.575 m)   Wt 103.4 kg   LMP 04/28/2021   SpO2 98%   BMI 41.70 kg/m  No intake/output data recorded. No intake/output data recorded.  FHT: 145 bpm, moderate variability, +10x10 accels, +early/occasional variable decel UC: Q 7-1mins  SVE:   Dilation: 7 Effacement (%): 90 Station: -1 Exam by:: Varney Baas, RN  Labs: Lab Results  Component Value Date   WBC 7.9 02/04/2022   HGB 12.5 02/04/2022   HCT 38.8 02/04/2022   MCV 81.2 02/04/2022   PLT 240 02/04/2022    Assessment / Plan: Shelley Hill is a 22 y.o. G1P0 at [redacted]w[redacted]d admitted for latent labor/new onset gHTN.  Labor: Unchanged from previous exam. Patient feeling increase in pelvic/rectal pressure. Will start low dose Pit and titrate prn per unit policy gHTN: BP's currently normotensive. Labs normal. Patient asymptomatic. Continue to monitor Fetal Wellbeing:  Category I currently, with occasional variable Pain Control:  Epidural I/D:  GBS pos > PCN Anticipated MOD:  NSVD   Renee Harder, CNM 02/04/2022, 2:01 PM

## 2022-02-04 NOTE — MAU Note (Signed)
.  Shelley Hill is a 22 y.o. at [redacted]w[redacted]d here in MAU reporting: CTX since 0230 constantly, with some mucus discharge. Pt denies VB, LOF, DFM, PIH s/s, recent intercourse, and complications in the pregnancy.  [place] [facility] Onset of complaint: 0230 Pain score: 6/10 Vitals:   02/04/22 0440 02/04/22 0445  BP: (!) 149/97 (!) 172/113  Pulse: 100 92  Resp: 18   Temp: 98.2 F (36.8 C)   SpO2: 100% 100%     FHT:145 Lab orders placed from triage:

## 2022-02-04 NOTE — H&P (Signed)
OBSTETRIC ADMISSION HISTORY AND PHYSICAL  Shelley Hill is a 22 y.o. female G1P0 with IUP at [redacted]w[redacted]d by LMP presenting for new onset gTHN, SOL. She reports +FMs, No LOF, no VB, no blurry vision, headaches or peripheral edema, and RUQ pain.  She plans on breast feeding. She request pills for birth control. She received her prenatal care at [facility]: By LMP --->  Estimated Date of Delivery: 02/02/22  Sono:    @[redacted]w[redacted]d , CWD, normal anatomy, 45% EFW   Prenatal History/Complications:  Patient Active Problem List   Diagnosis Date Noted   Gestational HTN 02/04/2022   Obstructive sleep apnea 02/23/2012    Past Medical History: Past Medical History:  Diagnosis Date   Chronic headaches     Past Surgical History: Past Surgical History:  Procedure Laterality Date   NO PAST SURGERIES      Obstetrical History: OB History     Gravida  1   Para      Term      Preterm      AB      Living         SAB      IAB      Ectopic      Multiple      Live Births              Social History Social History   Socioeconomic History   Marital status: Single    Spouse name: Not on file   Number of children: Not on file   Years of education: Not on file   Highest education level: Not on file  Occupational History   Not on file  Tobacco Use   Smoking status: Never   Smokeless tobacco: Never  Vaping Use   Vaping Use: Never used  Substance and Sexual Activity   Alcohol use: No   Drug use: Never   Sexual activity: Yes  Other Topics Concern   Not on file  Social History Narrative   Not on file   Social Determinants of Health   Financial Resource Strain: Not on file  Food Insecurity: No Food Insecurity (02/04/2022)   Hunger Vital Sign    Worried About Running Out of Food in the Last Year: Never true    Ran Out of Food in the Last Year: Never true  Transportation Needs: No Transportation Needs (02/04/2022)   PRAPARE - Hydrologist  (Medical): No    Lack of Transportation (Non-Medical): No  Physical Activity: Not on file  Stress: Not on file  Social Connections: Not on file    Family History: Family History  Problem Relation Age of Onset   Epilepsy Father    Cancer Brother    Hypertension Paternal Grandmother    Heart disease Paternal Grandmother    Asthma Neg Hx     Allergies: No Known Allergies  Medications Prior to Admission  Medication Sig Dispense Refill Last Dose   Prenatal Vit-Fe Fumarate-FA (MULTIVITAMIN-PRENATAL) 27-0.8 MG TABS tablet Take 1 tablet by mouth daily at 12 noon.   02/03/2022     Review of Systems   All systems reviewed and negative except as stated in HPI  Blood pressure (!) 139/99, pulse 93, temperature 98.1 F (36.7 C), temperature source Oral, resp. rate 17, last menstrual period 04/28/2021, SpO2 100 %. General appearance: alert, cooperative, appears stated age, and mild distress Lungs: clear to auscultation bilaterally Heart: regular rate and rhythm Abdomen: soft, non-tender; bowel sounds  normal Pelvic: no lesions Extremities: Homans sign is negative, no sign of DVT Presentation: cephalic Fetal monitoring Baseline: 150 bpm, Variability: Good {> 6 bpm), Accelerations: Reactive, and Decelerations: Absent Uterine activityFrequency: Every 2-5 minutes Dilation: 4 Effacement (%): 90 Exam by:: Youlanda Roys, RN  Prenatal labs: ABO, Rh: --/--/PENDING (01/21 3086) Antibody: PENDING (01/21 0529) Rubella:  IMM RPR:   NEG HBsAg:   NR HIV:   NR GBS:   POS 1 hr Glucola nml Genetic screening  alpha thal carrier Anatomy US nml  Prenatal Transfer Tool  Maternal Diabetes: No Genetic Screening: Abnormal:  Results: Other: alpha thal carrier Maternal Ultrasounds/Referrals: Normal Fetal Ultrasounds or other Referrals:  None Maternal Substance Abuse:  No Significant Maternal Medications:  None Significant Maternal Lab Results:  Group B Strep positive Number of Prenatal  Visits:greater than 3 verified prenatal visits Other Comments:  None  Results for orders placed or performed during the hospital encounter of 02/04/22 (from the past 24 hour(s))  Protein / creatinine ratio, urine   Collection Time: 02/04/22  5:22 AM  Result Value Ref Range   Creatinine, Urine 135 mg/dL   Total Protein, Urine 19 mg/dL   Protein Creatinine Ratio 0.14 0.00 - 0.15 mg/mg[Cre]  CBC   Collection Time: 02/04/22  5:29 AM  Result Value Ref Range   WBC 7.9 4.0 - 10.5 K/uL   RBC 4.78 3.87 - 5.11 MIL/uL   Hemoglobin 12.5 12.0 - 15.0 g/dL   HCT 38.8 36.0 - 46.0 %   MCV 81.2 80.0 - 100.0 fL   MCH 26.2 26.0 - 34.0 pg   MCHC 32.2 30.0 - 36.0 g/dL   RDW 13.2 11.5 - 15.5 %   Platelets 240 150 - 400 K/uL   nRBC 0.0 0.0 - 0.2 %  Comprehensive metabolic panel   Collection Time: 02/04/22  5:29 AM  Result Value Ref Range   Sodium 133 (L) 135 - 145 mmol/L   Potassium 4.1 3.5 - 5.1 mmol/L   Chloride 103 98 - 111 mmol/L   CO2 19 (L) 22 - 32 mmol/L   Glucose, Bld 109 (H) 70 - 99 mg/dL   BUN 9 6 - 20 mg/dL   Creatinine, Ser 0.79 0.44 - 1.00 mg/dL   Calcium 9.1 8.9 - 10.3 mg/dL   Total Protein 7.0 6.5 - 8.1 g/dL   Albumin 3.1 (L) 3.5 - 5.0 g/dL   AST 22 15 - 41 U/L   ALT 15 0 - 44 U/L   Alkaline Phosphatase 91 38 - 126 U/L   Total Bilirubin 0.1 (L) 0.3 - 1.2 mg/dL   GFR, Estimated >60 >60 mL/min   Anion gap 11 5 - 15  Type and screen [place]   Collection Time: 02/04/22  5:29 AM  Result Value Ref Range   ABO/RH(D) PENDING    Antibody Screen PENDING    Sample Expiration      02/07/2022,2359 Performed at [facility], [address], [place], [place] [address]     Patient Active Problem List   Diagnosis Date Noted   Gestational HTN 02/04/2022   Obstructive sleep apnea 02/23/2012    Assessment/Plan:  Shelley Hill is a 22 y.o. G1P0 at [redacted]w[redacted]d here for new onset gestational hypertension, 100 labor  #Labor: Expectant management #Pain: IV pain  meds, epidural upon request #FWB: Category 1 #ID:  GBS positive penicillin started #MOF: Breast #MOC: pills  Shelda Pal, DO  02/04/2022, 6:56 AM

## 2022-02-04 NOTE — Progress Notes (Signed)
Shelley Hill is a 22 y.o. G1P0 at [redacted]w[redacted]d admitted for latent labor/new onset gHTN  Subjective: Shelley Hill is doing well. She is comfortable with epidural. She has no concerns/questions at this time.   Objective: BP (!) 110/55   Pulse 78   Temp 98.4 F (36.9 C) (Oral)   Resp 16   Ht 5\' 2"  (1.575 m)   Wt 103.4 kg   LMP 04/28/2021   SpO2 98%   BMI 41.70 kg/m  No intake/output data recorded. No intake/output data recorded.  FHT: 145 bpm, moderate variability, +15x15 accels, +early/variable decel UC: Q7 mins with ui SVE:   Dilation: 6 Effacement (%): 90 Station: -2 Exam by:: Varney Baas, RN  Labs: Lab Results  Component Value Date   WBC 7.9 02/04/2022   HGB 12.5 02/04/2022   HCT 38.8 02/04/2022   MCV 81.2 02/04/2022   PLT 240 02/04/2022    Assessment / Plan: Shelley Hill is a 22 y.o. G1P0 at [redacted]w[redacted]d admitted for latent labor/new onset gHTN.  Labor: Progressing. Consider AROM once adequately treated for GBS gHTN: BP's currently normotensive. Labs normal. Patient asymptomatic. Continue to monitor Fetal Wellbeing:  Category I currently, with occasional variable Pain Control:  Epidural I/D:  GBS pos > PCN Anticipated MOD:  NSVD   Renee Harder, CNM 02/04/2022, 9:26 AM

## 2022-02-04 NOTE — Anesthesia Preprocedure Evaluation (Signed)
Anesthesia Evaluation  Patient identified by MRN, date of birth, ID band Patient awake    Reviewed: Allergy & Precautions, Patient's Chart, lab work & pertinent test results  History of Anesthesia Complications Negative for: history of anesthetic complications  Airway Mallampati: II  TM Distance: >3 FB Neck ROM: Full    Dental no notable dental hx.    Pulmonary sleep apnea    Pulmonary exam normal        Cardiovascular hypertension (gestational), Normal cardiovascular exam     Neuro/Psych  Headaches  negative psych ROS   GI/Hepatic negative GI ROS, Neg liver ROS,,,  Endo/Other    Morbid obesity  Renal/GU negative Renal ROS  negative genitourinary   Musculoskeletal negative musculoskeletal ROS (+)    Abdominal   Peds  Hematology negative hematology ROS (+)   Anesthesia Other Findings Day of surgery medications reviewed with patient.  Reproductive/Obstetrics (+) Pregnancy                             Anesthesia Physical Anesthesia Plan  ASA: 3  Anesthesia Plan: Epidural   Post-op Pain Management:    Induction:   PONV Risk Score and Plan: Treatment may vary due to age or medical condition  Airway Management Planned: Natural Airway  Additional Equipment: Fetal Monitoring  Intra-op Plan:   Post-operative Plan:   Informed Consent: I have reviewed the patients History and Physical, chart, labs and discussed the procedure including the risks, benefits and alternatives for the proposed anesthesia with the patient or authorized representative who has indicated his/her understanding and acceptance.       Plan Discussed with:   Anesthesia Plan Comments:        Anesthesia Quick Evaluation

## 2022-02-05 ENCOUNTER — Ambulatory Visit: Payer: Medicaid Other

## 2022-02-05 MED ORDER — FUROSEMIDE 20 MG PO TABS
20.0000 mg | ORAL_TABLET | Freq: Every day | ORAL | Status: DC
  Administered 2022-02-05 – 2022-02-06 (×2): 20 mg via ORAL
  Filled 2022-02-05 (×2): qty 1

## 2022-02-05 MED ORDER — NIFEDIPINE ER OSMOTIC RELEASE 30 MG PO TB24
30.0000 mg | ORAL_TABLET | Freq: Every day | ORAL | Status: DC
  Administered 2022-02-05 – 2022-02-06 (×2): 30 mg via ORAL
  Filled 2022-02-05 (×2): qty 1

## 2022-02-05 NOTE — Anesthesia Postprocedure Evaluation (Signed)
Anesthesia Post Note  Patient: Shelley Hill  Procedure(s) Performed: AN AD Ava     Patient location during evaluation: Mother Baby Anesthesia Type: Epidural Level of consciousness: awake and alert and oriented Pain management: satisfactory to patient Vital Signs Assessment: post-procedure vital signs reviewed and stable Respiratory status: respiratory function stable Cardiovascular status: stable Postop Assessment: no headache, no backache, epidural receding, patient able to bend at knees, no signs of nausea or vomiting, adequate PO intake and able to ambulate Anesthetic complications: no   No notable events documented.  Last Vitals:  Vitals:   02/05/22 0006 02/05/22 0545  BP: 128/80 (!) 140/87  Pulse: (!) 117 92  Resp: 16 18  Temp: 36.9 C 36.7 C  SpO2: 100% 100%    Last Pain:  Vitals:   02/05/22 0545  TempSrc: Oral  PainSc: 2    Pain Goal:                   [name]

## 2022-02-05 NOTE — Progress Notes (Signed)
Post Partum Day 1 Subjective: Shelley Hill is doing well this morning. She reports no issues throughout the night. She is up ad lib, voiding, tolerating PO, and + flatus. No complaints of headaches, vision changes, or dizziness.   Objective: Blood pressure (!) 144/92, pulse 98, temperature 98.2 F (36.8 C), temperature source Oral, resp. rate 17, height 5\' 2"  (1.575 m), weight 103.4 kg, last menstrual period 04/28/2021, SpO2 100 %, unknown if currently breastfeeding.  Physical Exam:  General: alert and no distress Lochia: appropriate Uterine Fundus: firm Incision: n/a DVT Evaluation: No evidence of DVT seen on physical exam.  Recent Labs    02/04/22 0529 02/04/22 1819  HGB 12.5 11.3*  HCT 38.8 33.6*    Assessment/Plan: Postpartum day 1, meeting expected goals gHTN: BP's consistently in the 140s/90s. Procardia 30mg  and Lasix ordered. Patient asymptomatic. Continue to work with lactation for breastfeeding support. Plan for discharge tomorrow   LOS: 1 day   Renee Harder, CNM 02/05/2022, 10:53 AM

## 2022-02-05 NOTE — Lactation Note (Signed)
This note was copied from a baby's chart. Lactation Consultation Note  Patient Name: Shelley Hill HALPF'X Date: 02/05/2022 Reason for consult: Initial assessment;Primapara;1st time breastfeeding;Term Age:21 hours   P1: Term infant at 40+2 weeks Feeding preference: Breast/formula Weight loss: 3%  "Shelley Hill" was swaddled and asleep in visitor's arms when I arrived.  Since it had been three hours since her last feeding I offered to assist with waking and latching; mother receptive.  Taught hand expression; no drops noted.  Assisted to latch in the football hold, however, "Shelley Hill" was not interested in initiating a suck.  Demonstrated breast compressions and gentle stimulation.  Also suggested mother continue lots of STS, breast massage and hand expression.  She will finger feed/spoon feed any expressed drops to baby.  Placed her STS and she fell asleep.  Breast feeding basics reviewed.  Informed parents that "Shelley Hill" will probably start cluster feeding later today/tonight.  Mother will call her [place] for assistance as needed.  Support person and multiple visitors present.   Maternal Data Has patient been taught Hand Expression?: Yes Does the patient have breastfeeding experience prior to this delivery?: No  Feeding Mother's Current Feeding Choice: Breast Milk and Formula  LATCH Score Latch: Too sleepy or reluctant, no latch achieved, no sucking elicited.  Audible Swallowing: None  Type of Nipple: Everted at rest and after stimulation (Short shafted)  Comfort (Breast/Nipple): Soft / non-tender  Hold (Positioning): Assistance needed to correctly position infant at breast and maintain latch.  LATCH Score: 5   Lactation Tools Discussed/Used Tools: Pump;Flanges Flange Size: 21 Breast pump type: Manual Pump Education: Setup, frequency, and cleaning;Milk Storage Reason for Pumping: Nipple eversion; breast stimulation Pumping frequency: Prior to feeding and  prn  Interventions Interventions: Breast feeding basics reviewed;Assisted with latch;Skin to skin;Breast massage;Hand express;Pre-pump if needed;Breast compression;Position options;Support pillows;Adjust position;Education;LC Services brochure  Discharge Pump: Personal (Unsure of brand)  Consult Status Consult Status: Follow-up Date: 02/06/22 Follow-up type: In-patient    Little Ishikawa 02/05/2022, 8:42 AM

## 2022-02-06 ENCOUNTER — Other Ambulatory Visit (HOSPITAL_COMMUNITY): Payer: Self-pay

## 2022-02-06 MED ORDER — NIFEDIPINE ER 30 MG PO TB24
30.0000 mg | ORAL_TABLET | Freq: Every day | ORAL | 0 refills | Status: DC
  Filled 2022-02-06: qty 30, 30d supply, fill #0

## 2022-02-06 MED ORDER — SENNOSIDES-DOCUSATE SODIUM 8.6-50 MG PO TABS
2.0000 | ORAL_TABLET | Freq: Every day | ORAL | 0 refills | Status: DC
  Filled 2022-02-06: qty 30, 15d supply, fill #0

## 2022-02-06 MED ORDER — SIMETHICONE 80 MG PO CHEW
80.0000 mg | CHEWABLE_TABLET | ORAL | 0 refills | Status: DC | PRN
  Filled 2022-02-06: qty 15, 15d supply, fill #0

## 2022-02-06 MED ORDER — FUROSEMIDE 20 MG PO TABS
20.0000 mg | ORAL_TABLET | Freq: Every day | ORAL | 0 refills | Status: DC
  Filled 2022-02-06: qty 5, 5d supply, fill #0

## 2022-02-06 MED ORDER — NORETHINDRONE 0.35 MG PO TABS: 1.0000 | ORAL_TABLET | Freq: Every day | ORAL | 2 refills | Status: DC

## 2022-02-06 MED ORDER — IBUPROFEN 600 MG PO TABS
600.0000 mg | ORAL_TABLET | Freq: Four times a day (QID) | ORAL | 0 refills | Status: DC | PRN
  Filled 2022-02-06: qty 30, 8d supply, fill #0

## 2022-02-06 NOTE — Progress Notes (Signed)
Shelley Hill  Post Partum Day 2:S/P SVD with Repair of 1st Degree and Right Labial  Subjective: Patient up ad lib, denies syncope or dizziness. Reports consuming regular diet without issues and denies N/V. Denies issues with urination and reports bleeding is "lightening up."  Patient is breast and bottlefeeding and denies issues with her breast.  Desires POPs for postpartum contraception.  Pain is being appropriately managed with use of ibuprofen.  Objective: Vitals:   02/05/22 0856 02/05/22 1602 02/05/22 2148 02/06/22 0620  BP: (!) 144/92 122/68 126/68 123/73  Pulse: 98 93 99 98  Resp: 17 17 18 18   Temp: 98.2 F (36.8 C) 98.4 F (36.9 C) 98.5 F (36.9 C) 98.5 F (36.9 C)  TempSrc: Oral Oral Oral Oral  SpO2:    100%  Weight:      Height:       Recent Labs    02/04/22 0529 02/04/22 1819  HGB 12.5 11.3*  HCT 38.8 33.6*    Physical Exam:  General: alert, cooperative, and no distress Mood/Affect: Appropriate/Bright Lungs: clear to auscultation, no wheezes, rales or rhonchi, symmetric air entry.  Heart: normal rate and regular rhythm. Breast: not examined. Abdomen:  + bowel sounds, Soft, NT Uterine Fundus: firm displaced to the left at +2/U Lochia: appropriate Laceration: Not Assessed Skin: Warm, Dry DVT Evaluation: No evidence of DVT seen on physical exam. Calf/Ankle edema is present.  Assessment S/P Vaginal Delivery-Day 2 Normal [employer]olution Breast/Bottle Feeding GHTN  Plan: -Reviewed postpartum care including medication management, breastfeeding, blood pressure monitoring, and follow up. -Patient scheduled for BP check on 1/29 at [facility] in [place].  -Return precautions reviewed. -Rx for Ibuprofen, Senokot, Procardia, and Laxis [facility]  -L&D team to be updated on patient status   Maryann Conners, MSN, CNM 02/06/2022, 10:06 AM

## 2022-02-12 ENCOUNTER — Other Ambulatory Visit: Payer: Self-pay

## 2022-02-12 ENCOUNTER — Ambulatory Visit: Payer: Medicaid Other | Admitting: *Deleted

## 2022-02-12 VITALS — BP 118/75 | HR 95 | Ht 62.0 in | Wt 207.8 lb

## 2022-02-12 DIAGNOSIS — Z789 Other specified health status: Secondary | ICD-10-CM

## 2022-02-12 MED ORDER — PRENATAL VITAMIN 27-0.8 MG PO TABS: 1.0000 | ORAL_TABLET | Freq: Every day | ORAL | 11 refills | Status: DC

## 2022-02-12 NOTE — Progress Notes (Signed)
Here for bp check s/p vaginal delivery 02/04/22 at [redacted]w[redacted]d with GHTN. D/C on Nifedipine 30mg  daily which she is taking. She denies headaches. BP wnl. Advised to continue taking Nifedipine as ordered. Advised to schedule postpartum visit. Reviewed symptoms of pre-eclampsia . She voices understanding. Staci Acosta

## 2022-02-12 NOTE — Progress Notes (Signed)
Patient was assessed and managed by nursing staff during this encounter. I have reviewed the chart and agree with the documentation and plan. I have also made any necessary editorial changes.  Arrie Zuercher A Elmus Mathes, MD 02/12/2022 11:15 PM   

## 2023-05-14 ENCOUNTER — Emergency Department (HOSPITAL_COMMUNITY)
Admission: EM | Admit: 2023-05-14 | Discharge: 2023-05-15 | Discharge status: Against Medical Advice | Attending: Emergency Medicine | Admitting: Emergency Medicine

## 2023-05-14 ENCOUNTER — Other Ambulatory Visit: Payer: Self-pay

## 2023-05-14 ENCOUNTER — Emergency Department (HOSPITAL_COMMUNITY)

## 2023-05-14 ENCOUNTER — Encounter (HOSPITAL_COMMUNITY): Payer: Self-pay | Admitting: *Deleted

## 2023-05-14 DIAGNOSIS — R0602 Shortness of breath: Secondary | ICD-10-CM | POA: Insufficient documentation

## 2023-05-14 DIAGNOSIS — Z5321 Procedure and treatment not carried out due to patient leaving prior to being seen by health care provider: Secondary | ICD-10-CM | POA: Insufficient documentation

## 2023-05-14 MED ORDER — IPRATROPIUM-ALBUTEROL 0.5-2.5 (3) MG/3ML IN SOLN
3.0000 mL | Freq: Once | RESPIRATORY_TRACT | Status: AC
  Administered 2023-05-14: 3 mL via RESPIRATORY_TRACT
  Filled 2023-05-14: qty 3

## 2023-05-14 NOTE — ED Triage Notes (Signed)
 Pt having sob for the past 4 days. Recently had a cat in her home in the past 2 weeks, and travel by car to [place] within the month. Denies any medical hx

## 2023-05-15 NOTE — ED Notes (Signed)
 Patient stated that she was leaving and would not be returning.

## 2023-12-04 ENCOUNTER — Ambulatory Visit
Admission: RE | Admit: 2023-12-04 | Discharge: 2023-12-04 | Disposition: A | Discharge status: Home / Self Care | Source: Ambulatory Visit | Attending: Nurse Practitioner | Admitting: Nurse Practitioner

## 2023-12-04 VITALS — BP 125/83 | HR 87 | Temp 98.9°F | Resp 17

## 2023-12-04 DIAGNOSIS — R350 Frequency of micturition: Secondary | ICD-10-CM | POA: Insufficient documentation

## 2023-12-04 DIAGNOSIS — Z3201 Encounter for pregnancy test, result positive: Secondary | ICD-10-CM | POA: Insufficient documentation

## 2023-12-04 DIAGNOSIS — Z113 Encounter for screening for infections with a predominantly sexual mode of transmission: Secondary | ICD-10-CM | POA: Diagnosis not present

## 2023-12-04 LAB — POCT URINE DIPSTICK
Bilirubin, UA: NEGATIVE
Blood, UA: NEGATIVE
Glucose, UA: NEGATIVE mg/dL
Ketones, POC UA: NEGATIVE mg/dL
Nitrite, UA: NEGATIVE
POC PROTEIN,UA: NEGATIVE
Spec Grav, UA: 1.02 (ref 1.010–1.025)
Urobilinogen, UA: 0.2 U/dL
pH, UA: 8.5 — AB (ref 5.0–8.0)

## 2023-12-04 LAB — POCT URINE PREGNANCY: Preg Test, Ur: POSITIVE — AB

## 2023-12-04 MED ORDER — AMOXICILLIN 500 MG PO CAPS: 500.0000 mg | ORAL_CAPSULE | Freq: Two times a day (BID) | ORAL | 0 refills | Status: AC

## 2023-12-04 NOTE — Discharge Instructions (Addendum)
 Your urine pregnancy test was positive.  Please establish with an obstetrician for your prenatal care.  Your urine shows you may have a UTI we will send this urine for culture and contact you with those results.  Start amoxicillin  twice daily for 5 days.  The clinic will also contact you with results of the STD testing done today if positive.  Lots of rest and fluids.  Please follow-up with your PCP if your urinary symptoms do not improve.  Please go to the ER for any worsening symptoms.  Hope you feel better soon exclamation

## 2023-12-04 NOTE — ED Provider Notes (Signed)
 [facility]    CSN: [id number] Arrival date & time: 12/04/23  1157      History   Chief Complaint Chief Complaint  Patient presents with   Possible Pregnancy    Entered by patient   Urinary Frequency    HPI Shelley Hill is a 23 y.o. female presents for urinary frequency and possible pregnancy.  Patient reports a couple days of urinary frequency with some mild burning.  Does report last menstrual cycle was October 30 and states she had a positive home pregnancy test last night.  She is not on birth control.  Denies fevers, flank pain.  Has had some nausea without vomiting.  No vaginal bleeding, abdominal cramping, or STD concern but she would like screening.  No other concerns at this time.   Possible Pregnancy  Urinary Frequency    Past Medical History:  Diagnosis Date   Chronic headaches     Patient Active Problem List   Diagnosis Date Noted   First degree perineal laceration during delivery 02/06/2022   Gestational HTN 02/04/2022   SVD (spontaneous vaginal delivery) 02/04/2022   Group B streptococcal carriage complicating pregnancy 02/04/2022   Obstructive sleep apnea 02/23/2012    Past Surgical History:  Procedure Laterality Date   NO PAST SURGERIES      OB History     Gravida  1   Para  1   Term  1   Preterm      AB      Living  1      SAB      IAB      Ectopic      Multiple  0   Live Births  1            Home Medications    Prior to Admission medications   Medication Sig Start Date End Date Taking? Authorizing Provider  amoxicillin  (AMOXIL ) 500 MG capsule Take 1 capsule (500 mg total) by mouth 2 (two) times daily for 5 days. 12/04/23 12/09/23 Yes Christionna Poland, Jodi R, NP  ibuprofen  (ADVIL ) 600 MG tablet Take 1 tablet (600 mg total) by mouth every 6 (six) hours as needed. 02/06/22  Yes Synthia Raisin, CNM  NIFEdipine  (ADALAT  CC) 30 MG 24 hr tablet Take 1 tablet (30 mg total) by mouth daily. 02/06/22  Yes Synthia Raisin,  CNM  furosemide  (LASIX ) 20 MG tablet Take 1 tablet (20 mg total) by mouth daily. 02/06/22   Synthia Raisin, CNM  norethindrone  (MICRONOR ) 0.35 MG tablet Take 1 tablet (0.35 mg total) by mouth daily. Patient not taking: Reported on 02/12/2022 02/06/22   Synthia Raisin, CNM  Prenatal Vit-Fe Fumarate-FA (MULTIVITAMIN-PRENATAL) 27-0.8 MG TABS tablet Take 1 tablet by mouth daily at 12 noon.    [provider]  Prenatal Vit-Fe Fumarate-FA (PRENATAL VITAMIN) 27-0.8 MG TABS Take 1 tablet by mouth at bedtime. 02/12/22   Eldonna Suzen Octave, MD  senna-docusate (SENOKOT-S) 8.6-50 MG tablet Take 2 tablets by mouth daily. 02/06/22   Synthia Raisin, CNM  simethicone  (MYLICON) 80 MG chewable tablet Chew 1 tablet (80 mg total) by mouth as needed for flatulence. 02/06/22   Synthia Raisin, CNM    Family History Family History  Problem Relation Age of Onset   Epilepsy Father    Cancer Brother    Hypertension Paternal Grandmother    Heart disease Paternal Grandmother    Asthma Neg Hx     Social History Social History   Tobacco Use   Smoking status: Never  Smokeless tobacco: Never  Vaping Use   Vaping status: Never Used  Substance Use Topics   Alcohol use: No   Drug use: Never     Allergies   Patient has no known allergies.   Review of Systems Review of Systems  Genitourinary:  Positive for frequency and menstrual problem.     Physical Exam Triage Vital Signs ED Triage Vitals  Encounter Vitals Group     BP 12/04/23 1224 125/83     Girls Systolic BP Percentile --      Girls Diastolic BP Percentile --      Boys Systolic BP Percentile --      Boys Diastolic BP Percentile --      Pulse Rate 12/04/23 1224 87     Resp 12/04/23 1224 17     Temp 12/04/23 1224 98.9 F (37.2 C)     Temp src --      SpO2 12/04/23 1224 98 %     Weight --      Height --      Head Circumference --      Peak Flow --      Pain Score 12/04/23 1222 0     Pain Loc --      Pain Education --      Exclude  from Growth Chart --    No data found.  Updated Vital Signs BP 125/83 (BP Location: Left Arm)   Pulse 87   Temp 98.9 F (37.2 C)   Resp 17   LMP 11/10/2023 (Exact Date)   SpO2 98%   Breastfeeding No   Visual Acuity Right Eye Distance:   Left Eye Distance:   Bilateral Distance:    Right Eye Near:   Left Eye Near:    Bilateral Near:     Physical Exam Vitals and nursing note reviewed.  Constitutional:      Appearance: Normal appearance.  HENT:     Head: Normocephalic and atraumatic.  Eyes:     Pupils: Pupils are equal, round, and reactive to light.  Cardiovascular:     Rate and Rhythm: Normal rate.  Pulmonary:     Effort: Pulmonary effort is normal.  Abdominal:     Tenderness: There is no right CVA tenderness or left CVA tenderness.  Skin:    General: Skin is warm and dry.  Neurological:     General: No focal deficit present.     Mental Status: She is alert and oriented to person, place, and time.  Psychiatric:        Mood and Affect: Mood normal.        Behavior: Behavior normal.      UC Treatments / Results  Labs (all labs ordered are listed, but only abnormal results are displayed) Labs Reviewed  POCT URINE PREGNANCY - Abnormal; Notable for the following components:      Result Value   Preg Test, Ur Positive (*)    All other components within normal limits  POCT URINE DIPSTICK - Abnormal; Notable for the following components:   pH, UA 8.5 (*)    Leukocytes, UA Trace (*)    All other components within normal limits  URINE CULTURE  CERVICOVAGINAL ANCILLARY ONLY    EKG   Radiology No results found.  Procedures Procedures (including critical care time)  Medications Ordered in UC Medications - No data to display  Initial Impression / Assessment and Plan / UC Course  I have reviewed the triage vital signs and the nursing  notes.  Pertinent labs & imaging results that were available during my care of the patient were reviewed by me and  considered in my medical decision making (see chart for details).      urine hCG positive, informed patient to establish with OB for prenatal care.  UA with trace leuks as patient is symptomatic we will send urine culture and start amoxicillin twice daily.  Vaginal swab/STD testing is ordered and will contact for any positive results.  Encouraged rest fluids and PCP follow-up if urinary symptoms do not improve.  ER precautions reviewed. Final Clinical Impressions(s) / UC Diagnoses   Final diagnoses:  Positive urine pregnancy test  Urinary frequency  Screening examination for STD (sexually transmitted disease)     Discharge Instructions      Your urine pregnancy test was positive.  Please establish with an obstetrician for your prenatal care.  Your urine shows you may have a UTI we will send this urine for culture and contact you with those results.  Start amoxicillin twice daily for 5 days.  The clinic will also contact you with results of the STD testing done today if positive.  Lots of rest and fluids.  Please follow-up with your PCP if your urinary symptoms do not improve.  Please go to the ER for any worsening symptoms.  Hope you feel better soon exclamation    ED Prescriptions     Medication Sig Dispense Auth. Provider   amoxicillin (AMOXIL) 500 MG capsule Take 1 capsule (500 mg total) by mouth 2 (two) times daily for 5 days. 10 capsule Nicholette Dolson, Jodi R, NP      PDMP not reviewed this encounter.   Loreda Myla SAUNDERS, NP 12/04/23 [phone]

## 2023-12-04 NOTE — ED Triage Notes (Signed)
 Pt present for pregnancy confirmation.   Pt states she took a pregnancy test last night. States she started feeling nauseous yesterday and urinating frequently. Pt states the test was positive.

## 2023-12-05 ENCOUNTER — Telehealth: Payer: Self-pay

## 2023-12-05 NOTE — Telephone Encounter (Signed)
 Pt is to come to UC and re-swab. Pt called, unable to leave a voicemail.

## 2023-12-06 ENCOUNTER — Ambulatory Visit (HOSPITAL_COMMUNITY): Payer: Self-pay

## 2023-12-06 LAB — URINE CULTURE: Culture: <10000 — AB

## 2023-12-22 ENCOUNTER — Ambulatory Visit
Admission: EM | Admit: 2023-12-22 | Discharge: 2023-12-22 | Disposition: A | Discharge status: Home / Self Care | Attending: Family Medicine | Admitting: Family Medicine

## 2023-12-22 DIAGNOSIS — O219 Vomiting of pregnancy, unspecified: Secondary | ICD-10-CM

## 2023-12-22 MED ORDER — DOXYLAMINE-PYRIDOXINE 10-10 MG PO TBEC: 1.0000 | DELAYED_RELEASE_TABLET | Freq: Two times a day (BID) | ORAL | 0 refills | Status: AC

## 2023-12-22 NOTE — ED Triage Notes (Signed)
 Pt c/o nausea and fatigue x 3 days-states she is [redacted] weeks pregnant-states she needs RTW note because she was not able to work today-NAD-steady

## 2023-12-22 NOTE — ED Provider Notes (Signed)
 [facility]  Note:  This document was prepared using Conservation officer, historic buildings and may include unintentional dictation errors.  MRN: [id number] DOB: 2000-01-28  Subjective:   Shelley Hill is a 23 y.o. female [redacted] weeks pregnant presenting for 3 day history of nausea, occasional vomiting, fatigue. Denies fever, abdominal pain, pelvic pain, rashes, dysuria, urinary frequency, hematuria, vaginal discharge.  Has an OB/GYN appointment [facility].   No current facility-administered medications for this encounter.  Current Outpatient Medications:    furosemide  (LASIX ) 20 MG tablet, Take 1 tablet (20 mg total) by mouth daily., Disp: 5 tablet, Rfl: 0   ibuprofen  (ADVIL ) 600 MG tablet, Take 1 tablet (600 mg total) by mouth every 6 (six) hours as needed., Disp: 30 tablet, Rfl: 0   NIFEdipine  (ADALAT  CC) 30 MG 24 hr tablet, Take 1 tablet (30 mg total) by mouth daily., Disp: 30 tablet, Rfl: 0   norethindrone  (MICRONOR ) 0.35 MG tablet, Take 1 tablet (0.35 mg total) by mouth daily. (Patient not taking: Reported on 02/12/2022), Disp: 90 tablet, Rfl: 2   Prenatal Vit-Fe Fumarate-FA (MULTIVITAMIN-PRENATAL) 27-0.8 MG TABS tablet, Take 1 tablet by mouth daily at 12 noon., Disp: , Rfl:    Prenatal Vit-Fe Fumarate-FA (PRENATAL VITAMIN) 27-0.8 MG TABS, Take 1 tablet by mouth at bedtime., Disp: 30 tablet, Rfl: 11   senna-docusate (SENOKOT-S) 8.6-50 MG tablet, Take 2 tablets by mouth daily., Disp: 30 tablet, Rfl: 0   simethicone  (MYLICON) 80 MG chewable tablet, Chew 1 tablet (80 mg total) by mouth as needed for flatulence., Disp: 15 tablet, Rfl: 0   No Known Allergies  Past Medical History:  Diagnosis Date   Chronic headaches      Past Surgical History:  Procedure Laterality Date   NO PAST SURGERIES      Family History  Problem Relation Age of Onset   Epilepsy Father    Cancer Brother    Hypertension Paternal Grandmother    Heart  disease Paternal Grandmother    Asthma Neg Hx     Social History   Tobacco Use   Smoking status: Former    Types: Cigarettes   Smokeless tobacco: Never  Vaping Use   Vaping status: Never Used  Substance Use Topics   Alcohol use: No   Drug use: Never    ROS   Objective:   Vitals: BP (!) 133/90 (BP Location: Left Arm)   Pulse 83   Temp 98.6 F (37 C) (Oral)   Resp 20   LMP 11/10/2023 (Exact Date)   SpO2 96%   Physical Exam Constitutional:      General: She is not in acute distress.    Appearance: Normal appearance. She is well-developed. She is not ill-appearing, toxic-appearing or diaphoretic.  HENT:     Head: Normocephalic and atraumatic.     Nose: Nose normal.     Mouth/Throat:     Mouth: Mucous membranes are moist.  Eyes:     General: No scleral icterus.       Right eye: No discharge.        Left eye: No discharge.     Extraocular Movements: Extraocular movements intact.     Conjunctiva/sclera: Conjunctivae normal.  Cardiovascular:     Rate and Rhythm: Normal rate.  Pulmonary:     Effort: Pulmonary effort is normal.  Abdominal:     General: Bowel sounds are normal. There is no distension.     Palpations: Abdomen is soft.  There is no mass.     Tenderness: There is no abdominal tenderness. There is no right CVA tenderness, left CVA tenderness, guarding or rebound.  Skin:    General: Skin is warm and dry.  Neurological:     General: No focal deficit present.     Mental Status: She is alert and oriented to person, place, and time.  Psychiatric:        Mood and Affect: Mood normal.        Behavior: Behavior normal.        Thought Content: Thought content normal.        Judgment: Judgment normal.     Assessment and Plan :   PDMP not reviewed this encounter.  1. Nausea/vomiting in pregnancy    Diclegis  recommended. No signs of an acute obstetrics emergency. Counseled patient on potential for adverse effects with medications prescribed/recommended  today, ER and return-to-clinic precautions discussed, patient verbalized understanding.    Christopher Savannah, PA-C 12/22/23 [id number]

## 2023-12-23 ENCOUNTER — Ambulatory Visit: Payer: Self-pay

## 2024-01-28 ENCOUNTER — Ambulatory Visit: Payer: Self-pay

## 2024-01-29 ENCOUNTER — Telehealth: Payer: Self-pay | Admitting: Obstetrics and Gynecology

## 2024-01-29 NOTE — Telephone Encounter (Signed)
 Attempted to reach patient to schedule for a New OB appointment. Phone is not working.

## 2024-01-31 ENCOUNTER — Ambulatory Visit
Admission: EM | Admit: 2024-01-31 | Discharge: 2024-01-31 | Disposition: A | Discharge status: Home / Self Care | Attending: Family Medicine | Admitting: Family Medicine

## 2024-01-31 DIAGNOSIS — Z3A11 11 weeks gestation of pregnancy: Secondary | ICD-10-CM | POA: Insufficient documentation

## 2024-01-31 DIAGNOSIS — Z202 Contact with and (suspected) exposure to infections with a predominantly sexual mode of transmission: Secondary | ICD-10-CM | POA: Diagnosis present

## 2024-01-31 MED ORDER — METRONIDAZOLE 500 MG PO TABS: 500.0000 mg | ORAL_TABLET | Freq: Two times a day (BID) | ORAL | 0 refills | Status: DC

## 2024-01-31 NOTE — Discharge Instructions (Addendum)
 Avoid all forms of sexual intercourse (oral, vaginal, anal) for the next 7 days to avoid spreading/reinfecting or at least until we can see what kinds of infection results are positive.  Abstaining for 2 weeks would be better but at least 1 week is required.  We will let you know about your test results from the swab we did today and if you need any prescriptions for antibiotics or changes to your treatment from today.

## 2024-01-31 NOTE — ED Triage Notes (Signed)
 Pt requested STD's test as her partner tested positive for Trichomonas. Pt is [redacted] weeks pregnant. Denies and symptoms.

## 2024-01-31 NOTE — ED Provider Notes (Signed)
 " [facility]  Note:  This document was prepared using Conservation officer, historic buildings and may include unintentional dictation errors.  MRN: [id number] DOB: 2000-09-17  Subjective:   Shelley Hill is a 24 y.o. female [redacted] weeks pregnant presenting for STI testing and treatment.  Reports that her sex partner tested positive for trichomonas and would like treatment.  Denies fever, n/v, abdominal pain, pelvic pain, rashes, dysuria, urinary frequency, hematuria, vaginal discharge, vaginal bleeding.    Current Outpatient Medications  Medication Instructions   Doxylamine -Pyridoxine  (DICLEGIS ) 10-10 MG TBEC 1 tablet, Oral, 2 times daily   furosemide  (LASIX ) 20 mg, Oral, Daily   ibuprofen  (ADVIL ) 600 mg, Oral, Every 6 hours PRN   NIFEdipine  (ADALAT  CC) 30 mg, Oral, Daily   norethindrone  (MICRONOR ) 0.35 mg, Oral, Daily   Prenatal Vit-Fe Fumarate-FA (MULTIVITAMIN-PRENATAL) 27-0.8 MG TABS tablet 1 tablet, Oral, Daily   Prenatal Vit-Fe Fumarate-FA (PRENATAL VITAMIN) 27-0.8 MG TABS 1 tablet, Oral, Daily at bedtime   senna-docusate (SENOKOT-S) 8.6-50 MG tablet 2 tablets, Oral, Daily   simethicone  (MYLICON) 80 mg, Oral, As needed    Allergies[1]  Past Medical History:  Diagnosis Date   Chronic headaches      Past Surgical History:  Procedure Laterality Date   NO PAST SURGERIES      Family History  Problem Relation Age of Onset   Epilepsy Father    Cancer Brother    Hypertension Paternal Grandmother    Heart disease Paternal Grandmother    Asthma Neg Hx     Social History   Occupational History   Not on file  Tobacco Use   Smoking status: Former    Types: Cigarettes   Smokeless tobacco: Never  Vaping Use   Vaping status: Never Used  Substance and Sexual Activity   Alcohol use: No   Drug use: Never   Sexual activity: Yes    Birth control/protection: None     ROS   Objective:   Vitals: BP 128/83 (BP Location: Left Arm)   Pulse 82   Temp  98.9 F (37.2 C) (Oral)   Resp 18   LMP 11/10/2023 (Exact Date)   SpO2 98%   Breastfeeding No   Physical Exam Constitutional:      General: She is not in acute distress.    Appearance: Normal appearance. She is well-developed. She is not ill-appearing, toxic-appearing or diaphoretic.  HENT:     Head: Normocephalic and atraumatic.     Nose: Nose normal.     Mouth/Throat:     Mouth: Mucous membranes are moist.  Eyes:     General: No scleral icterus.       Right eye: No discharge.        Left eye: No discharge.     Extraocular Movements: Extraocular movements intact.     Conjunctiva/sclera: Conjunctivae normal.  Cardiovascular:     Rate and Rhythm: Normal rate.  Pulmonary:     Effort: Pulmonary effort is normal.  Abdominal:     General: Bowel sounds are normal. There is no distension.     Palpations: Abdomen is soft. There is no mass.     Tenderness: There is no abdominal tenderness. There is no right CVA tenderness, left CVA tenderness, guarding or rebound.  Skin:    General: Skin is warm and dry.  Neurological:     General: No focal deficit present.     Mental Status: She is alert and oriented to person, place, and time.  Psychiatric:  Mood and Affect: Mood normal.        Behavior: Behavior normal.        Thought Content: Thought content normal.        Judgment: Judgment normal.     Assessment and Plan :   PDMP not reviewed this encounter.  1. Exposure to trichomonas   2. [redacted] weeks gestation of pregnancy      We will treat patient empirically for bacterial vaginosis with Flagyl .  Labs pending. Counseled patient on potential for adverse effects with medications prescribed/recommended today, ER and return-to-clinic precautions discussed, patient verbalized understanding.     [1] No Known Allergies    Christopher Savannah, [place] 01/31/24 1854  "

## 2024-02-03 ENCOUNTER — Ambulatory Visit (HOSPITAL_COMMUNITY): Payer: Self-pay

## 2024-02-03 LAB — CERVICOVAGINAL ANCILLARY ONLY
Bacterial Vaginitis (gardnerella): POSITIVE — AB
Candida Glabrata: NEGATIVE
Candida Vaginitis: POSITIVE — AB
Chlamydia: NEGATIVE
Comment: NEGATIVE
Comment: NEGATIVE
Comment: NEGATIVE
Comment: NEGATIVE
Comment: NEGATIVE
Comment: NORMAL
Neisseria Gonorrhea: NEGATIVE
Trichomonas: NEGATIVE

## 2024-02-03 MED ORDER — CLOTRIMAZOLE 3 2 % VA CREA: 1.0000 | TOPICAL_CREAM | Freq: Every day | VAGINAL | 0 refills | Status: AC

## 2024-02-13 ENCOUNTER — Telehealth: Payer: Self-pay

## 2024-02-13 DIAGNOSIS — O0990 Supervision of high risk pregnancy, unspecified, unspecified trimester: Secondary | ICD-10-CM | POA: Insufficient documentation

## 2024-02-13 DIAGNOSIS — O99210 Obesity complicating pregnancy, unspecified trimester: Secondary | ICD-10-CM | POA: Insufficient documentation

## 2024-02-13 DIAGNOSIS — Z3201 Encounter for pregnancy test, result positive: Secondary | ICD-10-CM

## 2024-02-13 DIAGNOSIS — Z8759 Personal history of other complications of pregnancy, childbirth and the puerperium: Secondary | ICD-10-CM | POA: Insufficient documentation

## 2024-02-13 MED ORDER — PRENATAL 27-1 MG PO TABS: 1.0000 | ORAL_TABLET | Freq: Every day | ORAL | 11 refills | Status: AC

## 2024-02-13 NOTE — Progress Notes (Signed)
 New OB Intake  I connected with Shelley Hill  on 02/13/24 at  8:15 AM EST by MyChart Video Visit and verified that I am speaking with the correct person using two identifiers. Nurse is located at [facility] and pt is located at home.  I discussed the limitations, risks, security and privacy concerns of performing an evaluation and management service by telephone and the availability of in person appointments. I also discussed with the patient that there may be a patient responsible charge related to this service. The patient expressed understanding and agreed to proceed.  I explained I am completing New OB Intake today. We discussed EDD of 08/16/24 based on LMP of 11/10/23. Pt is G2P1001. I reviewed her allergies, medications and Medical/Surgical/OB history.  She [facility] I asked her to have them send us  a copy of US  report.   Patient Active Problem List   Diagnosis Date Noted   Supervision of high risk pregnancy, antepartum 02/13/2024   Obesity in pregnancy 02/13/2024   History of gestational hypertension 02/13/2024   Obstructive sleep apnea 02/23/2012     Concerns addressed today  Delivery Plans Plans to deliver at [facility]. Discussed the nature of our practice with multiple providers including residents and students as well as female and female providers. Due to the size of the practice, the delivering provider may not be the same as those providing prenatal care.   Patient is interested in water birth.  MyChart/Babyscripts MyChart access verified. I explained pt will have some visits in office and some virtually. Babyscripts instructions given and order placed.  Blood Pressure Cuff/Weight Scale She has a BP cuff.  Explained after first prenatal appt pt will check weekly and document in Babyscripts. Patient does have weight scale.  Anatomy US  Explained first scheduled US  will be around 19 weeks. Anatomy US  scheduled for 03/30/24 at 1:30.  Is  patient a CenteringPregnancy candidate?  Accepted   Is patient a Mom+Baby Combined Care candidate?  Not a candidate    Is patient a candidate for Babyscripts Optimization? No, due to hx GHTN.  First visit review I reviewed new OB appt with patient. Explained pt will be seen by Dr. Herchel at first visit. Discussed [place] genetic screening with patient. She would like both Panorama and Horizon drawn with routine prenatal labs at new ob appointment.   Last Pap No results found for: EDMON Rock Skip OBIE 02/13/2024  9:00 AM

## 2024-02-20 ENCOUNTER — Other Ambulatory Visit: Payer: Self-pay

## 2024-02-20 ENCOUNTER — Encounter: Payer: Self-pay | Admitting: Obstetrics & Gynecology

## 2024-02-20 ENCOUNTER — Ambulatory Visit: Payer: Self-pay | Admitting: Obstetrics & Gynecology

## 2024-02-20 VITALS — BP 115/78 | HR 90 | Wt 217.0 lb

## 2024-02-20 DIAGNOSIS — D563 Thalassemia minor: Secondary | ICD-10-CM | POA: Insufficient documentation

## 2024-02-20 DIAGNOSIS — Z3A14 14 weeks gestation of pregnancy: Secondary | ICD-10-CM

## 2024-02-20 DIAGNOSIS — Z8759 Personal history of other complications of pregnancy, childbirth and the puerperium: Secondary | ICD-10-CM

## 2024-02-20 DIAGNOSIS — Z1329 Encounter for screening for other suspected endocrine disorder: Secondary | ICD-10-CM

## 2024-02-20 DIAGNOSIS — O0990 Supervision of high risk pregnancy, unspecified, unspecified trimester: Secondary | ICD-10-CM

## 2024-02-20 DIAGNOSIS — Z131 Encounter for screening for diabetes mellitus: Secondary | ICD-10-CM

## 2024-02-20 DIAGNOSIS — O99210 Obesity complicating pregnancy, unspecified trimester: Secondary | ICD-10-CM

## 2024-02-20 MED ORDER — GOJJI WEIGHT SCALE MISC: 1.0000 | Freq: Once | 0 refills | Status: DC

## 2024-02-20 MED ORDER — GOJJI WEIGHT SCALE MISC: 1.0000 | Freq: Once | 0 refills | Status: AC

## 2024-02-20 MED ORDER — BLOOD PRESSURE KIT DEVI: 1.0000 | 0 refills | Status: AC

## 2024-02-20 MED ORDER — BLOOD PRESSURE KIT DEVI: 1.0000 | 0 refills | Status: DC

## 2024-02-20 MED ORDER — ASPIRIN 81 MG PO TBEC: 162.0000 mg | DELAYED_RELEASE_TABLET | Freq: Every day | ORAL | 2 refills | Status: AC

## 2024-02-20 NOTE — Progress Notes (Signed)
 "  INITIAL PRENATAL VISIT  History:  Braiden Presutti is a 24 y.o. G2P1001 at [redacted]w[redacted]d by LMP being seen today for her first obstetrical visit.  Her obstetrical history is significant for one term SVD in 2024, pregnancy complicated by [facility]. Patient does intend to breast feed. Pregnancy history fully reviewed.  Patient has history of OSA diagnosis, no intervention.  Stable asthma.  Patient reports no complaints.  HISTORY: OB History  Gravida Para Term Preterm AB Living  2 1 1  0 0 1  SAB IAB Ectopic Multiple Live Births  0 0 0 0 1    # Outcome Date GA Lbr Len/2nd Weight Sex Type Anes PTL Lv  2 Current           1 Term 02/04/22 [redacted]w[redacted]d 14:15 / 00:37 6 lb 9.8 oz (3 kg) F Vag-Spont EPI  LIV     Birth Comments: GHTN     Name: YANET, BALLIET     Apgar1: 9  Apgar5: 9  Never had a pap  Past Medical History:  Diagnosis Date   Alpha thalassemia silent carrier 02/20/2024   Asthma    Chronic headaches    Gestational hypertension 2024   Obstructive sleep apnea 02/23/2012   Past Surgical History:  Procedure Laterality Date   NO PAST SURGERIES     Family History  Problem Relation Age of Onset   Hypertension Mother    Epilepsy Father    Cancer Brother    Leukemia Brother    Hypertension Paternal Grandmother    Heart disease Paternal Grandmother    Asthma Neg Hx    Social History[1] Allergies[2] Medications Ordered Prior to Encounter[3]  Review of Systems Pertinent items noted in HPI and remainder of comprehensive ROS otherwise negative.  Indications for ASA therapy (per UpToDate) One of the following: (Needs 162 mg daily) Two or more of the following: (Can do 81 mg daily) Nulliparity Yes Obesity (body mass index >30 kg/m2) Yes Sociodemographic characteristics (African American race) Yes  Physical Exam:  Chaperone Elenor Mole, RN   Vitals:   02/20/24 1459  BP: 115/78  Pulse: 90  Weight: 217 lb (98.4 kg)   Fetal Heart Rate (bpm): 154   General:  well-developed, well-nourished female in no acute distress  Breasts:  normal appearance, no masses or tenderness bilaterally, exam done in the presence of a chaperone.   Skin: normal coloration and turgor, no rashes  Neurologic: oriented, normal, negative, normal mood  Extremities: normal strength, tone, and muscle mass, ROM of all joints is normal  HEENT PERRLA, extraocular movement intact and sclera clear, anicteric  Neck supple and no masses  Cardiovascular: regular rate and rhythm  Respiratory:  no respiratory distress, normal breath sounds  Abdomen: soft, non-tender; bowel sounds normal; no masses,  no organomegaly  Pelvic: normal external genitalia, no lesions, normal vaginal mucosa, normal vaginal discharge, normal cervix, pap smear done. Exam done in the presence of a chaperone.    Assessment:  Pregnancy: G2P1001 Patient Active Problem List   Diagnosis Date Noted   Alpha thalassemia silent carrier 02/20/2024   Supervision of high risk pregnancy, antepartum 02/13/2024   Obesity in pregnancy 02/13/2024   History of gestational hypertension 02/13/2024   Obstructive sleep apnea 02/23/2012    Plan:  1. History of gestational hypertension Already on Babyscripts, she was told to check BP weekly and enter into app.  Healthy diet and exercise recommended, TWG 11-20 lbs. ASA prescribed for preeclampsia prophylaxis. - aspirin  EC 81 MG tablet; Take 2  tablets (162 mg total) by mouth at bedtime. Start taking when you are [redacted] weeks pregnant for rest of pregnancy for prevention of preeclampsia  Dispense: 300 tablet; Refill: 2  2. Obesity in pregnancy, antepartum (Primary) ASA prescribed for preeclampsia prophylaxis. - aspirin  EC 81 MG tablet; Take 2 tablets (162 mg total) by mouth at bedtime. Start taking when you are [redacted] weeks pregnant for rest of pregnancy for prevention of preeclampsia  Dispense: 300 tablet; Refill: 2  3. Screening for endocrine disorder - TSH Rfx on Abnormal to Free  T4  4. Screening for diabetes mellitus - Hemoglobin A1c  5. Alpha thalassemia silent carrier Will check FOB  6. [redacted] weeks gestation of pregnancy 7. Supervision of high risk pregnancy, antepartum - Culture, OB Urine - CBC/D/Plt+RPR+Rh+ABO+RubIgG... - PANORAMA PRENATAL TEST - Cytology - PAP( [place]) - Comprehensive metabolic panel with GFR  Initial labs drawn. Continue prenatal vitamins. Problem list reviewed and updated. Genetic Screening discussed, Panorama: ordered. Ultrasound discussed; fetal anatomic survey: scheduled. Anticipatory guidance about prenatal visits given including labs, ultrasounds, and testing. Discussed usage of the Babyscripts app for more information about pregnancy, and to track blood pressures. Patient was encouraged to use MyChart to review results, send requests, and have questions addressed.   The nature of Center for [facility] Healthcare/Faculty Practice with multiple MDs and Advanced Practice Providers was explained to patient; also emphasized that residents, students are part of our team.  Also emphasized that we do have female providers; female-only providers cannot be guaranteed. Routine obstetric precautions reviewed. Encouraged to seek out care at our office or emergency room [facility] preferred) for urgent and/or emergent concerns. Return in about 4 weeks (around 03/19/2024) for OFFICE OB VISIT (MD or APP).     GLORIS HUGGER, MD, FACOG Obstetrician & Gynecologist, [facility] for [employer], [facility]     [1]  Social History Tobacco Use   Smoking status: Never   Smokeless tobacco: Never  Vaping Use   Vaping status: Never Used  Substance Use Topics   Alcohol use: Not Currently    Comment: socially occasionally   Drug use: Never  [2] No Known Allergies [3]  Current Outpatient Medications on File Prior to Visit  Medication Sig Dispense Refill   Prenatal 27-1 MG TABS Take 1 tablet by mouth daily. 30  tablet 11   Doxylamine -Pyridoxine  (DICLEGIS ) 10-10 MG TBEC Take 1 tablet by mouth 2 (two) times daily. (Patient not taking: Reported on 02/20/2024) 60 tablet 0   No current facility-administered medications on file prior to visit.   "

## 2024-02-21 ENCOUNTER — Ambulatory Visit: Payer: Self-pay | Admitting: Obstetrics & Gynecology

## 2024-02-21 DIAGNOSIS — O0990 Supervision of high risk pregnancy, unspecified, unspecified trimester: Secondary | ICD-10-CM

## 2024-02-21 DIAGNOSIS — O99810 Abnormal glucose complicating pregnancy: Secondary | ICD-10-CM | POA: Insufficient documentation

## 2024-02-21 LAB — CBC/D/PLT+RPR+RH+ABO+RUBIGG...
Antibody Screen: NEGATIVE
Basophils Absolute: 0 10*3/uL (ref 0.0–0.2)
Basos: 0 %
EOS (ABSOLUTE): 0.1 10*3/uL (ref 0.0–0.4)
Eos: 1 %
HCV Ab: NONREACTIVE
HIV Screen 4th Generation wRfx: NONREACTIVE
Hematocrit: 37.6 % (ref 34.0–46.6)
Hemoglobin: 12.3 g/dL (ref 11.1–15.9)
Hepatitis B Surface Ag: NEGATIVE
Immature Grans (Abs): 0 10*3/uL (ref 0.0–0.1)
Immature Granulocytes: 0 %
Lymphocytes Absolute: 1.8 10*3/uL (ref 0.7–3.1)
Lymphs: 26 %
MCH: 26.5 pg — ABNORMAL LOW (ref 26.6–33.0)
MCHC: 32.7 g/dL (ref 31.5–35.7)
MCV: 81 fL (ref 79–97)
Monocytes Absolute: 0.4 10*3/uL (ref 0.1–0.9)
Monocytes: 6 %
Neutrophils Absolute: 4.7 10*3/uL (ref 1.4–7.0)
Neutrophils: 66 %
Platelets: 298 10*3/uL (ref 150–450)
RBC: 4.65 x10E6/uL (ref 3.77–5.28)
RDW: 12.6 % (ref 11.7–15.4)
RPR Ser Ql: NONREACTIVE
Rh Factor: POSITIVE
Rubella Antibodies, IGG: 12.1 {index}
WBC: 7.1 10*3/uL (ref 3.4–10.8)

## 2024-02-21 LAB — TSH RFX ON ABNORMAL TO FREE T4: TSH: 1.51 u[IU]/mL (ref 0.450–4.500)

## 2024-02-21 LAB — COMPREHENSIVE METABOLIC PANEL WITH GFR
ALT: 13 [IU]/L (ref 0–32)
AST: 12 [IU]/L (ref 0–40)
Albumin: 4.1 g/dL (ref 4.0–5.0)
Alkaline Phosphatase: 60 [IU]/L (ref 41–116)
BUN/Creatinine Ratio: 6 — ABNORMAL LOW (ref 9–23)
BUN: 4 mg/dL — ABNORMAL LOW (ref 6–20)
Bilirubin Total: 0.2 mg/dL (ref 0.0–1.2)
CO2: 19 mmol/L — ABNORMAL LOW (ref 20–29)
Calcium: 9.1 mg/dL (ref 8.7–10.2)
Chloride: 102 mmol/L (ref 96–106)
Creatinine, Ser: 0.64 mg/dL (ref 0.57–1.00)
Globulin, Total: 2.5 g/dL (ref 1.5–4.5)
Glucose: 113 mg/dL — ABNORMAL HIGH (ref 70–99)
Potassium: 4 mmol/L (ref 3.5–5.2)
Sodium: 137 mmol/L (ref 134–144)
Total Protein: 6.6 g/dL (ref 6.0–8.5)
eGFR: 128 mL/min/{1.73_m2}

## 2024-02-21 LAB — HEMOGLOBIN A1C
Est. average glucose Bld gHb Est-mCnc: 117 mg/dL
Hgb A1c MFr Bld: 5.7 % — ABNORMAL HIGH (ref 4.8–5.6)

## 2024-02-21 LAB — CYTOLOGY - PAP
Chlamydia: NEGATIVE
Comment: NEGATIVE
Comment: NEGATIVE
Comment: NEGATIVE
Comment: NORMAL
Diagnosis: NEGATIVE
High risk HPV: NEGATIVE
Neisseria Gonorrhea: NEGATIVE
Trichomonas: NEGATIVE

## 2024-02-21 LAB — HCV INTERPRETATION

## 2024-02-21 NOTE — Telephone Encounter (Addendum)
-----   Message from Gloris Hugger, MD sent at 02/21/2024  8:22 AM EST ----- Regarding: Please see result note about elevated A1C Patient needs early 2 hr GTT.   Thank you!   Gloris Hugger, MD  Attempted to contact pt and was unable to leave voicemail due to voicemail box not set up yet.    Curry Seefeldt,RN  02/21/24

## 2024-03-19 ENCOUNTER — Encounter: Payer: Self-pay | Admitting: Obstetrics and Gynecology

## 2024-03-30 ENCOUNTER — Ambulatory Visit

## 2024-04-16 ENCOUNTER — Encounter: Payer: Self-pay | Admitting: Obstetrics and Gynecology
